# Patient Record
Sex: Female | Born: 1985
Health system: Southern US, Community
[De-identification: ages and names within clinical notes are randomized; demographics above are authoritative.]

## PROBLEM LIST (undated history)

## (undated) DIAGNOSIS — T7840XA Allergy, unspecified, initial encounter: Secondary | ICD-10-CM

## (undated) DIAGNOSIS — F909 Attention-deficit hyperactivity disorder, unspecified type: Secondary | ICD-10-CM

## (undated) DIAGNOSIS — D649 Anemia, unspecified: Secondary | ICD-10-CM

## (undated) DIAGNOSIS — F32A Depression, unspecified: Secondary | ICD-10-CM

## (undated) DIAGNOSIS — G4730 Sleep apnea, unspecified: Secondary | ICD-10-CM

## (undated) DIAGNOSIS — D229 Melanocytic nevi, unspecified: Secondary | ICD-10-CM

## (undated) HISTORY — DX: Anemia, unspecified: D64.9

## (undated) HISTORY — DX: Melanocytic nevi, unspecified: D22.9

## (undated) HISTORY — DX: Depression, unspecified: F32.A

## (undated) HISTORY — DX: Attention-deficit hyperactivity disorder, unspecified type: F90.9

## (undated) HISTORY — DX: Sleep apnea, unspecified: G47.30

## (undated) HISTORY — DX: Allergy, unspecified, initial encounter: T78.40XA

## (undated) HISTORY — PX: WISDOM TOOTH EXTRACTION: SHX21

---

## 2008-04-07 ENCOUNTER — Other Ambulatory Visit: Admission: RE | Admit: 2008-04-07 | Discharge: 2008-04-07 | Payer: Self-pay | Admitting: Family Medicine

## 2008-06-25 ENCOUNTER — Other Ambulatory Visit: Admission: RE | Admit: 2008-06-25 | Discharge: 2008-06-25 | Payer: Self-pay | Admitting: Family Medicine

## 2011-01-25 ENCOUNTER — Other Ambulatory Visit: Payer: Self-pay | Admitting: Family Medicine

## 2011-01-25 ENCOUNTER — Other Ambulatory Visit (HOSPITAL_COMMUNITY)
Admission: RE | Admit: 2011-01-25 | Discharge: 2011-01-25 | Disposition: A | Discharge status: Home / Self Care | Payer: 59 | Source: Ambulatory Visit | Attending: Family Medicine | Admitting: Family Medicine

## 2011-01-25 DIAGNOSIS — R87810 Cervical high risk human papillomavirus (HPV) DNA test positive: Secondary | ICD-10-CM | POA: Insufficient documentation

## 2011-01-25 DIAGNOSIS — Z124 Encounter for screening for malignant neoplasm of cervix: Secondary | ICD-10-CM | POA: Insufficient documentation

## 2011-04-07 ENCOUNTER — Other Ambulatory Visit: Payer: Self-pay | Admitting: Obstetrics and Gynecology

## 2011-10-08 ENCOUNTER — Other Ambulatory Visit (HOSPITAL_COMMUNITY)
Admission: RE | Admit: 2011-10-08 | Discharge: 2011-10-08 | Disposition: A | Discharge status: Home / Self Care | Payer: 59 | Source: Ambulatory Visit | Attending: Obstetrics and Gynecology | Admitting: Obstetrics and Gynecology

## 2011-10-08 ENCOUNTER — Other Ambulatory Visit: Payer: Self-pay | Admitting: Obstetrics and Gynecology

## 2011-10-08 DIAGNOSIS — Z01419 Encounter for gynecological examination (general) (routine) without abnormal findings: Secondary | ICD-10-CM | POA: Insufficient documentation

## 2012-04-26 ENCOUNTER — Other Ambulatory Visit (HOSPITAL_COMMUNITY)
Admission: RE | Admit: 2012-04-26 | Discharge: 2012-04-26 | Disposition: A | Discharge status: Home / Self Care | Payer: Self-pay | Source: Ambulatory Visit | Attending: Obstetrics and Gynecology | Admitting: Obstetrics and Gynecology

## 2012-04-26 ENCOUNTER — Other Ambulatory Visit: Payer: Self-pay | Admitting: Obstetrics and Gynecology

## 2012-04-26 DIAGNOSIS — Z01419 Encounter for gynecological examination (general) (routine) without abnormal findings: Secondary | ICD-10-CM | POA: Insufficient documentation

## 2012-04-26 DIAGNOSIS — Z1151 Encounter for screening for human papillomavirus (HPV): Secondary | ICD-10-CM | POA: Insufficient documentation

## 2017-08-12 DIAGNOSIS — S71131A Puncture wound without foreign body, right thigh, initial encounter: Secondary | ICD-10-CM | POA: Diagnosis not present

## 2017-08-12 DIAGNOSIS — S80851A Superficial foreign body, right lower leg, initial encounter: Secondary | ICD-10-CM | POA: Diagnosis not present

## 2018-05-15 DIAGNOSIS — Z23 Encounter for immunization: Secondary | ICD-10-CM | POA: Diagnosis not present

## 2018-09-21 DIAGNOSIS — Z719 Counseling, unspecified: Secondary | ICD-10-CM | POA: Diagnosis not present

## 2018-09-21 DIAGNOSIS — J309 Allergic rhinitis, unspecified: Secondary | ICD-10-CM | POA: Diagnosis not present

## 2018-10-12 DIAGNOSIS — E663 Overweight: Secondary | ICD-10-CM | POA: Diagnosis not present

## 2018-10-12 DIAGNOSIS — Z719 Counseling, unspecified: Secondary | ICD-10-CM | POA: Diagnosis not present

## 2020-08-14 LAB — HM PAP SMEAR

## 2020-08-14 LAB — RESULTS CONSOLE HPV: CHL HPV: NEGATIVE

## 2021-01-26 LAB — LIPID PANEL
Cholesterol: 204 — AB (ref 0–200)
HDL: 52 (ref 35–70)
LDL Cholesterol: 137
LDl/HDL Ratio: 2.6
Triglycerides: 73 (ref 40–160)

## 2021-01-26 LAB — CBC AND DIFFERENTIAL
HCT: 38 (ref 36–46)
Hemoglobin: 12.8 (ref 12.0–16.0)
Platelets: 223 (ref 150–399)
WBC: 5.4

## 2021-01-26 LAB — BASIC METABOLIC PANEL
BUN: 14 (ref 4–21)
CO2: 22 (ref 13–22)
Chloride: 102 (ref 99–108)
Creatinine: 0.7 (ref 0.5–1.1)
Glucose: 94
Potassium: 4 (ref 3.4–5.3)
Sodium: 136 — AB (ref 137–147)

## 2021-01-26 LAB — HEPATIC FUNCTION PANEL
ALT: 12 (ref 7–35)
AST: 16 (ref 13–35)
Alkaline Phosphatase: 94 (ref 25–125)
Bilirubin, Total: 0.4

## 2021-01-26 LAB — HM HIV SCREENING LAB: HM HIV Screening: NEGATIVE

## 2021-01-26 LAB — COMPREHENSIVE METABOLIC PANEL
Albumin: 4.5 (ref 3.5–5.0)
Calcium: 8.8 (ref 8.7–10.7)
GFR calc non Af Amer: 113

## 2021-01-26 LAB — TSH: TSH: 1.79 (ref 0.41–5.90)

## 2021-01-26 LAB — CBC: RBC: 4.36 (ref 3.87–5.11)

## 2021-01-26 LAB — HIV ANTIBODY (ROUTINE TESTING W REFLEX): HIV 1&2 Ab, 4th Generation: NONREACTIVE

## 2021-04-22 ENCOUNTER — Other Ambulatory Visit: Payer: Self-pay

## 2021-04-22 ENCOUNTER — Ambulatory Visit: Payer: 59 | Admitting: Physician Assistant

## 2021-04-22 ENCOUNTER — Encounter: Payer: Self-pay | Admitting: Physician Assistant

## 2021-04-22 VITALS — BP 110/80 | HR 76 | Temp 98.4°F | Ht 60.0 in

## 2021-04-22 DIAGNOSIS — F909 Attention-deficit hyperactivity disorder, unspecified type: Secondary | ICD-10-CM | POA: Insufficient documentation

## 2021-04-22 DIAGNOSIS — L918 Other hypertrophic disorders of the skin: Secondary | ICD-10-CM

## 2021-04-22 DIAGNOSIS — Q309 Congenital malformation of nose, unspecified: Secondary | ICD-10-CM | POA: Diagnosis not present

## 2021-04-22 NOTE — Patient Instructions (Addendum)
It was great to see you!  When we get your records from Dr. Ernie Hew I will let you know  If your nose area does not improve after cool compresses and reduction of manipulation, let me know and we will refer to ENT.   Take care,  Inda Coke PA-C

## 2021-04-22 NOTE — Progress Notes (Signed)
Adrienne Burch is a 35 y.o. female here to establish care.  History of Present Illness:   Chief Complaint  Patient presents with   Establish Care   ADHD   Nose Problem    Pt c/o pain bridge of nose and feels a slight bump for the past week.    Nose Bump Adrienne Burch began to express concern regarding a bump on the bridge of her nose that has been onset for a week. States she also feels pain upon palpation to the area and believes it could be due to the fact that she has been blowing her nose often. Adrienne Burch has admitted that instead of blowing her nose straight down, she tends to move her nose side to side upon blowing it.  She has taken an at home covid test which resulted as negative.  Denies recent injury, abnormal noises, blood at the site, or accessory changes.   Skin tags Adrienne Burch explains how she has noticed multiple acrochordons on both sides of her neck. They aren't causing any pain or issues but she would like these removed today.   ADHD Currently compliant with taking focalin XR 10 mg daily with no adverse effects. Due to fhx of anxiety and depression she is participating in talk therapy regularly. She is managing well at this time.   Depression screen PHQ 2/9 04/22/2021  Decreased Interest 0  Down, Depressed, Hopeless 0  PHQ - 2 Score 0    No flowsheet data found.   Other providers/specialists: Patient Care Team: Inda Coke, [place] as PCP - General (Physician Assistant)   Past Medical History:  Diagnosis Date   ADHD (attention deficit hyperactivity disorder)    Nevus    2 moles removed     Social History   Tobacco Use   Smoking status: Never   Smokeless tobacco: Never  Vaping Use   Vaping Use: Never used  Substance Use Topics   Alcohol use: Never   Drug use: Never    Past Surgical History:  Procedure Laterality Date   WISDOM TOOTH EXTRACTION     2018, 2019 half done each time    Family History  Problem Relation Age of Onset   Depression Mother     High Cholesterol Mother    Cancer Father    High blood pressure Father    Alcohol abuse Sister    Arthritis Maternal Grandmother    High Cholesterol Maternal Grandmother    Alcohol abuse Maternal Grandfather    Heart disease Paternal Grandmother    High blood pressure Paternal Grandmother    Stroke Paternal Grandmother    Cancer Paternal Grandfather     Allergies  Allergen Reactions   Oxycodone Nausea And Vomiting   Pertussis Vaccines Other (See Comments)    Happened as a child, listless and high fever     Current Medications:   Current Outpatient Medications:    levonorgestrel (MIRENA, 52 MG,) 20 MCG/DAY IUD, 1 each by Intrauterine route once. Inserted by GYN in 08/2019 needs to be removed in 7 yrs 08/2026, Disp: , Rfl:    Multiple Vitamin (MULTIVITAMIN) tablet, Take 1 tablet by mouth daily., Disp: , Rfl:    dexmethylphenidate (FOCALIN XR) 10 MG 24 hr capsule, Take 10 mg by mouth every morning., Disp: , Rfl:    Review of Systems:   ROS Negative unless otherwise specified per HPI.  Vitals:   Vitals:   04/22/21 1502  BP: 110/80  Pulse: 76  Temp: 98.4 F (36.9 C)  TempSrc: Temporal  SpO2: 99%  Height: 5' (1.524 m)      There is no height or weight on file to calculate BMI.  Physical Exam:   Physical Exam Vitals and nursing note reviewed.  Constitutional:      General: She is not in acute distress.    Appearance: She is well-developed. She is not ill-appearing or toxic-appearing.  HENT:     Nose:     Comments: Tenderness to upper cartilage area of nose Cardiovascular:     Rate and Rhythm: Normal rate and regular rhythm.     Pulses: Normal pulses.     Heart sounds: Normal heart sounds, S1 normal and S2 normal.  Pulmonary:     Effort: Pulmonary effort is normal.     Breath sounds: Normal breath sounds.  Skin:    General: Skin is warm and dry.     Comments: Skin tags -- 2 on L side of neck and 2 on R side of neck  Neurological:     Mental Status: She is  alert.     GCS: GCS eye subscore is 4. GCS verbal subscore is 5. GCS motor subscore is 6.  Psychiatric:        Speech: Speech normal.        Behavior: Behavior normal. Behavior is cooperative.   Procedure: Skin tag removal Informed consent:  Discussed risks (permanent scarring, infection, pain, bleeding, bruising, redness, and recurrence of the lesion) and benefits of the procedure, as well as the alternatives.  She is aware that skin tags are benign lesions, and their removal is often not considered medically necessary.  Informed consent was obtained. Anesthesia: Lidocaine with epinephrine  The area was prepared and draped in a standard fashion. Snip removal was performed.   Antibiotic ointment and a sterile dressing were applied.   The patient tolerated procedure well. The patient was instructed on post-op care.   Number of lesions removed:  2 located on right side of neck, 2 on left side of neck.  Assessment and Plan:   Attention Deficit Hyperactivity disorder, unspecified ADHD type Currently well controlled with medication regimen. Will refill Focalin XR 10 mg upon receiving verification of justified use.  Follow-up in office if any concerns. PDMP reviewed - no red flags.  Nasal abnormality No red flags Recommended cool compresses and avoiding touching If worsening or lack of improvement, will refer to ENT  Skin tag removal Tolerated well Denies immediate concerns Follow-up prn  I,Havlyn C Ratchford,acting as a scribe for [employer], PA.,have documented all relevant documentation on the behalf of Inda Coke, PA,as directed by  Inda Coke, PA while in the presence of Inda Coke, [place].  I, Inda Coke, [place], have reviewed all documentation for this visit. The documentation on 04/22/21 for the exam, diagnosis, procedures, and orders are all accurate and complete.   Inda Coke, PA-C

## 2021-06-15 ENCOUNTER — Encounter: Payer: Self-pay | Admitting: Physician Assistant

## 2021-06-23 ENCOUNTER — Encounter: Payer: Self-pay | Admitting: Physician Assistant

## 2021-06-26 ENCOUNTER — Other Ambulatory Visit: Payer: Self-pay

## 2021-06-26 ENCOUNTER — Encounter: Payer: Self-pay | Admitting: Physician Assistant

## 2021-06-26 ENCOUNTER — Ambulatory Visit: Payer: 59 | Admitting: Physician Assistant

## 2021-06-26 VITALS — BP 118/80 | HR 83 | Temp 97.7°F | Ht 60.0 in

## 2021-06-26 DIAGNOSIS — F909 Attention-deficit hyperactivity disorder, unspecified type: Secondary | ICD-10-CM

## 2021-06-26 MED ORDER — DEXMETHYLPHENIDATE HCL ER 10 MG PO CP24: 10.0000 mg | ORAL_CAPSULE | Freq: Every day | ORAL | 0 refills | Status: DC

## 2021-06-26 NOTE — Patient Instructions (Signed)
It was great to see you!  Continue Focalin 10 mg XR  Let's follow-up in 6 months, sooner if you have concerns.  Take care,  Inda Coke PA-C

## 2021-06-26 NOTE — Progress Notes (Signed)
Adrienne Burch is a 36 y.o. female here for a follow up of ADHD.  History of Present Illness:   Chief Complaint  Patient presents with   ADHD    HPI  ADHD Adrienne Burch states that she does take focalin XR 10 mg daily for the most part with no adverse effects. Admits that on days where she knows she is not going to be doing anything, she does not take the medication. Additionally pt did present verification of ADHD dx as request in previous visit on 04/22/21. At this time she is managing well. Denies SI/HI.    Past Medical History:  Diagnosis Date   ADHD (attention deficit hyperactivity disorder)    Nevus    2 moles removed     Social History   Tobacco Use   Smoking status: Never   Smokeless tobacco: Never  Vaping Use   Vaping Use: Never used  Substance Use Topics   Alcohol use: Yes    Comment: once per year   Drug use: Never    Past Surgical History:  Procedure Laterality Date   WISDOM TOOTH EXTRACTION     2018, 2019 half done each time    Family History  Problem Relation Age of Onset   Depression Mother    High Cholesterol Mother    Colon cancer Father 11   High blood pressure Father    Alcohol abuse Sister    Crohn's disease Sister    Arthritis Maternal Grandmother    High Cholesterol Maternal Grandmother    Alcohol abuse Maternal Grandfather    Heart disease Paternal Grandmother    High blood pressure Paternal Grandmother    Stroke Paternal Grandmother    Lung cancer Paternal Grandfather     Allergies  Allergen Reactions   Oxycodone Nausea And Vomiting   Pertussis Vaccines Other (See Comments)    Happened as a child, listless and high fever    Current Medications:   Current Outpatient Medications:    levonorgestrel (MIRENA, 52 MG,) 20 MCG/DAY IUD, 1 each by Intrauterine route once. Inserted by GYN in 08/2019 needs to be removed in 7 yrs 08/2026, Disp: , Rfl:    Multiple Vitamin (MULTIVITAMIN) tablet, Take 1 tablet by mouth daily., Disp: , Rfl:     Review of Systems:   ROS Negative unless otherwise specified per HPI. Vitals:   Vitals:   06/26/21 1134  BP: 118/80  Pulse: 83  Temp: 97.7 F (36.5 C)  TempSrc: Temporal  SpO2: 99%  Height: 5' (1.524 m)     There is no height or weight on file to calculate BMI.  Physical Exam:   Physical Exam Vitals and nursing note reviewed.  Constitutional:      General: She is not in acute distress.    Appearance: She is well-developed. She is not ill-appearing or toxic-appearing.  Cardiovascular:     Rate and Rhythm: Normal rate and regular rhythm.     Pulses: Normal pulses.     Heart sounds: Normal heart sounds, S1 normal and S2 normal.  Pulmonary:     Effort: Pulmonary effort is normal.     Breath sounds: Normal breath sounds.  Skin:    General: Skin is warm and dry.  Neurological:     Mental Status: She is alert.     GCS: GCS eye subscore is 4. GCS verbal subscore is 5. GCS motor subscore is 6.  Psychiatric:        Speech: Speech normal.  Behavior: Behavior normal. Behavior is cooperative.    Assessment and Plan:   Attention deficit hyperactivity disorder (ADHD), unspecified ADHD type Well controlled with medication regimen Continue Focalin XR 10 mg daily PDMP reviewed- no red flags Controlled substance contract signed today Follow up in 6 months, sooner if concerns occur  I,Havlyn C Ratchford,acting as a scribe for [employer], PA.,have documented all relevant documentation on the behalf of Inda Coke, PA,as directed by  Inda Coke, PA while in the presence of Inda Coke, [place].  I, Inda Coke, [place], have reviewed all documentation for this visit. The documentation on 06/26/21 for the exam, diagnosis, procedures, and orders are all accurate and complete.  Inda Coke, PA-C

## 2021-06-30 ENCOUNTER — Ambulatory Visit (INDEPENDENT_AMBULATORY_CARE_PROVIDER_SITE_OTHER): Payer: 59

## 2021-06-30 ENCOUNTER — Ambulatory Visit (HOSPITAL_COMMUNITY)
Admission: EM | Admit: 2021-06-30 | Discharge: 2021-06-30 | Disposition: A | Discharge status: Home / Self Care | Payer: 59 | Attending: Physician Assistant | Admitting: Physician Assistant

## 2021-06-30 ENCOUNTER — Other Ambulatory Visit: Payer: Self-pay

## 2021-06-30 ENCOUNTER — Telehealth: Payer: Self-pay

## 2021-06-30 ENCOUNTER — Ambulatory Visit: Admission: EM | Admit: 2021-06-30 | Payer: 59

## 2021-06-30 ENCOUNTER — Encounter (HOSPITAL_COMMUNITY): Payer: Self-pay

## 2021-06-30 DIAGNOSIS — M79644 Pain in right finger(s): Secondary | ICD-10-CM

## 2021-06-30 DIAGNOSIS — S6991XA Unspecified injury of right wrist, hand and finger(s), initial encounter: Secondary | ICD-10-CM

## 2021-06-30 DIAGNOSIS — M79641 Pain in right hand: Secondary | ICD-10-CM

## 2021-06-30 MED ORDER — NAPROXEN 500 MG PO TABS: 500.0000 mg | ORAL_TABLET | Freq: Two times a day (BID) | ORAL | 0 refills | Status: DC

## 2021-06-30 NOTE — Discharge Instructions (Signed)
Your x-ray was normal with no evidence of fracture which is great news.  Keep your hand elevated and use ice to manage swelling.  Take Naprosyn for pain relief.  You should not take additional NSAIDs including aspirin, ibuprofen/Advil, naproxen/Aleve with this medication as it can cause stomach bleeding.  You can use Tylenol as needed for breakthrough pain.  If your symptoms or not improving within a few days please follow-up with specialist; call to schedule an appointment.  If you develop any weakness, numbness, tingling sensation, inability to move your hand you should be seen immediately.

## 2021-06-30 NOTE — ED Triage Notes (Signed)
Pt reports jamming her R hand into a door today.   States she feels some pain and presents with minimum ROM.

## 2021-06-30 NOTE — Telephone Encounter (Signed)
Patient called asking what she should do. She slammed her hand in the door. Middle finger swollen and purple. Cannot bend well. Urgent Care advised.

## 2021-06-30 NOTE — ED Provider Notes (Signed)
Day    CSN: [id number] Arrival date & time: 06/30/21  1257      History   Chief Complaint Chief Complaint  Patient presents with   Hand Injury    HPI Adrienne Burch is a 36 y.o. female.   Patient presents today with a several hour history of right hand pain.  Reports earlier she was holding onto the door when she accidentally caught her index, middle, ring finger of her right hand in the door jam.  She reports pain is currently rated 5 on a 0-10 pain scale, localized to fingers, described as aching, worse with palpation or movement, no alleviating factors identified.  She has not tried any over-the-counter medication for symptom management.  She is right-handed and knits, crochets, plays instrument and so is very concerned about potential injury.  She is confident that she is not pregnant.  She denies any specific numbness or paresthesias but does report an overall heavy sensation and dull pain involving these fingers.   Past Medical History:  Diagnosis Date   ADHD (attention deficit hyperactivity disorder)    Nevus    2 moles removed    Patient Active Problem List   Diagnosis Date Noted   ADHD 04/22/2021    Past Surgical History:  Procedure Laterality Date   WISDOM TOOTH EXTRACTION     2018, 2019 half done each time    OB History   No obstetric history on file.      Home Medications    Prior to Admission medications   Medication Sig Start Date End Date Taking? Authorizing Provider  naproxen (NAPROSYN) 500 MG tablet Take 1 tablet (500 mg total) by mouth 2 (two) times daily. 06/30/21  Yes An Lannan, Derry Skill, PA-C  dexmethylphenidate (FOCALIN XR) 10 MG 24 hr capsule Take 1 capsule (10 mg total) by mouth daily. 06/26/21   Inda Coke, PA  levonorgestrel (MIRENA, 52 MG,) 20 MCG/DAY IUD 1 each by Intrauterine route once. Inserted by GYN in 08/2019 needs to be removed in 7 yrs 08/2026    [provider]  Multiple Vitamin (MULTIVITAMIN)  tablet Take 1 tablet by mouth daily.    [provider]    Family History Family History  Problem Relation Age of Onset   Depression Mother    High Cholesterol Mother    Colon cancer Father 57   High blood pressure Father    Alcohol abuse Sister    Crohn's disease Sister    Arthritis Maternal Grandmother    High Cholesterol Maternal Grandmother    Alcohol abuse Maternal Grandfather    Heart disease Paternal Grandmother    High blood pressure Paternal Grandmother    Stroke Paternal Grandmother    Lung cancer Paternal Grandfather     Social History Social History   Tobacco Use   Smoking status: Never   Smokeless tobacco: Never  Vaping Use   Vaping Use: Never used  Substance Use Topics   Alcohol use: Yes    Comment: once per year   Drug use: Never     Allergies   Oxycodone and Pertussis vaccines   Review of Systems Review of Systems  Constitutional:  Positive for activity change. Negative for appetite change, fatigue and fever.  Respiratory:  Negative for cough and shortness of breath.   Cardiovascular:  Negative for chest pain.  Gastrointestinal:  Negative for abdominal pain, diarrhea, nausea and vomiting.  Musculoskeletal:  Positive for arthralgias and myalgias. Negative for joint swelling.  Skin:  Negative for color change and wound.  Neurological:  Negative for dizziness, weakness, light-headedness and headaches.    Physical Exam Triage Vital Signs ED Triage Vitals  Enc Vitals Group     BP 06/30/21 1349 128/80     Pulse Rate 06/30/21 1349 71     Resp 06/30/21 1349 17     Temp 06/30/21 1349 98.1 F (36.7 C)     Temp Source 06/30/21 1349 Oral     SpO2 06/30/21 1349 100 %     Weight --      Height --      Head Circumference --      Peak Flow --      Pain Score 06/30/21 1347 4     Pain Loc --      Pain Edu? --      Excl. in [place]? --    No data found.  Updated Vital Signs BP 128/80 (BP Location: Right Arm)    Pulse 71    Temp 98.1 F (36.7  C) (Oral)    Resp 17    SpO2 100%   Visual Acuity Right Eye Distance:   Left Eye Distance:   Bilateral Distance:    Right Eye Near:   Left Eye Near:    Bilateral Near:     Physical Exam Vitals reviewed.  Constitutional:      General: She is awake. She is not in acute distress.    Appearance: Normal appearance. She is well-developed. She is not ill-appearing.     Comments: Very pleasant female appears stated age in no acute distress sitting comfortably in exam room  HENT:     Head: Normocephalic and atraumatic.  Cardiovascular:     Rate and Rhythm: Normal rate and regular rhythm.     Pulses:          Radial pulses are 2+ on the right side and 2+ on the left side.     Heart sounds: Normal heart sounds, S1 normal and S2 normal. No murmur heard.    Comments: Capillary fill within 2 seconds right fingers; only able to visualize capillary refill at nailbed due to nail polish. Pulmonary:     Effort: Pulmonary effort is normal.     Breath sounds: Normal breath sounds. No wheezing, rhonchi or rales.     Comments: Clear to auscultation bilaterally Musculoskeletal:     Right hand: Tenderness present. No swelling or bony tenderness. Decreased range of motion. Normal strength. Normal sensation. There is no disruption of two-point discrimination.     Right lower leg: No edema.     Left lower leg: No edema.     Comments: Right hand: Decreased range of motion with flexion of index/middle/ring finger.  Swelling noted with mild bruising.  Normal pincer grip strength.  Hand neurovascularly intact.  Psychiatric:        Behavior: Behavior is cooperative.     UC Treatments / Results  Labs (all labs ordered are listed, but only abnormal results are displayed) Labs Reviewed - No data to display  EKG   Radiology DG Hand Complete Right  Result Date: 06/30/2021 CLINICAL DATA:  Pt got her 2nd-4th finger stuck in a door today. EXAM: RIGHT HAND - COMPLETE 3+ VIEW COMPARISON:  None. FINDINGS:  There is no evidence of fracture or dislocation. There is no evidence of arthropathy or other focal bone abnormality. Soft tissues are unremarkable. IMPRESSION: Negative. Electronically Signed   By: Kathreen Devoid M.D.   On: 06/30/2021 14:07  Procedures Procedures (including critical care time)  Medications Ordered in UC Medications - No data to display  Initial Impression / Assessment and Plan / UC Course  I have reviewed the triage vital signs and the nursing notes.  Pertinent labs & imaging results that were available during my care of the patient were reviewed by me and considered in my medical decision making (see chart for details).     X-ray obtained given mechanism of injury showed no osseous abnormalities.  Discussed likely contusion as etiology of symptoms.  Encourage patient to use RICE protocol particularly elevation and ice to help with symptoms.  She was prescribed Naprosyn for symptom relief with instruction not to take additional NSAIDs with this medication due to risk of GI bleeding.  Encouraged her to avoid strenuous activity including regular use of dominant hand and was provided work excuse note for several days.  Discussed that if she has persistent symptoms or symptoms or not improving within a few days of should contact an specialist and was given contact information for local provider.  Discussed alarm symptoms that warrant emergent evaluation.  Strict return precautions given to which she expressed understanding.  Final Clinical Impressions(s) / UC Diagnoses   Final diagnoses:  Right hand pain  Hand injury, right, initial encounter     Discharge Instructions      Your x-ray was normal with no evidence of fracture which is great news.  Keep your hand elevated and use ice to manage swelling.  Take Naprosyn for pain relief.  You should not take additional NSAIDs including aspirin, ibuprofen/Advil, naproxen/Aleve with this medication as it can cause stomach bleeding.   You can use Tylenol as needed for breakthrough pain.  If your symptoms or not improving within a few days please follow-up with specialist; call to schedule an appointment.  If you develop any weakness, numbness, tingling sensation, inability to move your hand you should be seen immediately.     ED Prescriptions     Medication Sig Dispense Auth. Provider   naproxen (NAPROSYN) 500 MG tablet Take 1 tablet (500 mg total) by mouth 2 (two) times daily. 30 tablet Laurella Tull, Derry Skill, PA-C      PDMP not reviewed this encounter.   Terrilee Croak, PA-C 06/30/21 1450

## 2021-10-21 ENCOUNTER — Telehealth: Payer: Self-pay | Admitting: Physician Assistant

## 2021-10-21 NOTE — Telephone Encounter (Signed)
Pt requesting 90 day supply ?Pt requesting generic. ? ?. ?Encourage patient to contact the pharmacy for refills or they can request refills through [facility] ? ?LAST APPOINTMENT DATE:   ?06/26/21 ? ?NEXT APPOINTMENT DATE: ?01/01/22 ? ? ?MEDICATION: ?dexmethylphenidate (FOCALIN XR) 10 MG 24 hr capsule [[id number]]  ? ?Is the patient out of medication?  ?3 weeks left ? ?PHARMACY: ?[facility] - [place], [place]  ?[place], [place] [address]  ?Phone:  [phone]  Fax:  [phone] ? ?Let patient know to contact pharmacy at the end of the day to make sure medication is ready. ? ?Please notify patient to allow 48-72 hours to process  ?

## 2021-10-22 ENCOUNTER — Other Ambulatory Visit: Payer: Self-pay | Admitting: Physician Assistant

## 2021-10-22 MED ORDER — DEXMETHYLPHENIDATE HCL ER 10 MG PO CP24: 10.0000 mg | ORAL_CAPSULE | Freq: Every day | ORAL | 0 refills | Status: DC

## 2021-10-22 NOTE — Telephone Encounter (Signed)
Please see note for correct pharmacy per patient

## 2021-10-22 NOTE — Telephone Encounter (Signed)
Pt states rx was sent to wrong pharmacy. Please update and send to:   [facility] ([facility] ) - [place], [place]  [address] [place] [address]  Phone:  [phone]  Fax:  [phone]

## 2021-10-22 NOTE — Telephone Encounter (Signed)
Ok to refill 

## 2021-10-23 ENCOUNTER — Other Ambulatory Visit: Payer: Self-pay | Admitting: Physician Assistant

## 2021-10-23 MED ORDER — DEXMETHYLPHENIDATE HCL ER 10 MG PO CP24: 10.0000 mg | ORAL_CAPSULE | Freq: Every day | ORAL | 0 refills | Status: DC

## 2021-11-11 ENCOUNTER — Other Ambulatory Visit: Payer: Self-pay | Admitting: *Deleted

## 2021-11-11 NOTE — Telephone Encounter (Signed)
Pt called back reporting that [facility] was out of stock of dexmethylphenidate (FOCALIN XR) 10 MG 24 hr capsule [[id number]] . After calling around to multiple pharmacies she was able to find one that had her medication in stock. [facility] [address], [place], [place] [address]. Pt has about 4-5 days supply of medication left.

## 2021-11-11 NOTE — Telephone Encounter (Signed)
Adrienne Burch, pt needs refill on Focalin XR 10 mg sent to different pharmacy due to out of stock original pharmacy. I have updated pharmacy.

## 2021-11-11 NOTE — Telephone Encounter (Signed)
See other message sent to provider.

## 2021-11-12 MED ORDER — DEXMETHYLPHENIDATE HCL ER 10 MG PO CP24: 10.0000 mg | ORAL_CAPSULE | Freq: Every day | ORAL | 0 refills | Status: DC

## 2021-12-21 ENCOUNTER — Telehealth: Payer: Self-pay | Admitting: Physician Assistant

## 2021-12-21 MED ORDER — DEXMETHYLPHENIDATE HCL ER 10 MG PO CP24: 10.0000 mg | ORAL_CAPSULE | Freq: Every day | ORAL | 0 refills | Status: DC

## 2021-12-21 NOTE — Telephone Encounter (Signed)
Pt requesting refill for Focalin XR 10 mg. Pt is scheduled to see you on 7/28.

## 2021-12-21 NOTE — Telephone Encounter (Signed)
..   Encourage patient to contact the pharmacy for refills or they can request refills through [facility]:  Please schedule appointment if longer than 1 year  06/26/21  NEXT APPOINTMENT DATE: 01/01/22  MEDICATION:  30 Day RX dexmethylphenidate (FOCALIN XR) 10 MG 24 hr capsule  Is the patient out of medication? 1 week left  [facility] [address] [facility] [place] [facility]  Let patient know to contact pharmacy at the end of the day to make sure medication is ready.  Please notify patient to allow 48-72 hours to process

## 2022-01-01 ENCOUNTER — Ambulatory Visit: Payer: 59 | Admitting: Physician Assistant

## 2022-01-01 ENCOUNTER — Encounter: Payer: Self-pay | Admitting: Physician Assistant

## 2022-01-01 VITALS — BP 131/82 | HR 88 | Temp 98.3°F | Ht 60.0 in

## 2022-01-01 DIAGNOSIS — K929 Disease of digestive system, unspecified: Secondary | ICD-10-CM

## 2022-01-01 DIAGNOSIS — D229 Melanocytic nevi, unspecified: Secondary | ICD-10-CM | POA: Diagnosis not present

## 2022-01-01 DIAGNOSIS — L918 Other hypertrophic disorders of the skin: Secondary | ICD-10-CM

## 2022-01-01 NOTE — Patient Instructions (Signed)
It was great to see you!  I will be in touch with skin biopsy results  Referral to GI placed today -- they will call you.  Take care,  Inda Coke PA-C

## 2022-01-01 NOTE — Progress Notes (Signed)
Adrienne Burch is a 36 y.o. female here for a follow up of a pre-existing problem.  History of Present Illness:   Chief Complaint  Patient presents with   Follow-up    Having digestive issues,and 3 moles pt wants looked at.    HPI  Digestive problems Has always had some intestinal issues. Sister with Crohns, dad with intestinal cancer, aunt with colon cancer. She has increased gas, stool frequency. Had some suspected dairy intolerance. Occasional diarrhea. Breakfast seems to always "go through" her. This has been increased over the past 2-3 months. She does not feel like its related to focalin. Does not see blood in stool. Does not have abdominal pain.   Will occasional take lactaid and this does help. Would like to see GI.  Skin tags Has three skin tags that she would like removed. They are in bothersome locations.  Abnormal mole She has a mole on the left side of her abdomen and feels like its becoming more irregular. She is worried about this and would like it evaluated.    Past Medical History:  Diagnosis Date   ADHD (attention deficit hyperactivity disorder)    Nevus    2 moles removed     Social History   Tobacco Use   Smoking status: Never   Smokeless tobacco: Never  Vaping Use   Vaping Use: Never used  Substance Use Topics   Alcohol use: Yes    Comment: once per year   Drug use: Never    Past Surgical History:  Procedure Laterality Date   WISDOM TOOTH EXTRACTION     2018, 2019 half done each time    Family History  Problem Relation Age of Onset   Depression Mother    High Cholesterol Mother    Colon cancer Father 24   High blood pressure Father    Alcohol abuse Sister    Crohn's disease Sister    Arthritis Maternal Grandmother    High Cholesterol Maternal Grandmother    Alcohol abuse Maternal Grandfather    Heart disease Paternal Grandmother    High blood pressure Paternal Grandmother    Stroke Paternal Grandmother    Lung cancer  Paternal Grandfather     Allergies  Allergen Reactions   Oxycodone Nausea And Vomiting   Pertussis Vaccines Other (See Comments)    Happened as a child, listless and high fever    Current Medications:   Current Outpatient Medications:    dexmethylphenidate (FOCALIN XR) 10 MG 24 hr capsule, Take 1 capsule (10 mg total) by mouth daily., Disp: 90 capsule, Rfl: 0   levonorgestrel (MIRENA, 52 MG,) 20 MCG/DAY IUD, 1 each by Intrauterine route once. Inserted by GYN in 08/2019 needs to be removed in 7 yrs 08/2026, Disp: , Rfl:    Multiple Vitamin (MULTIVITAMIN) tablet, Take 1 tablet by mouth daily., Disp: , Rfl:    naproxen (NAPROSYN) 500 MG tablet, Take 1 tablet (500 mg total) by mouth 2 (two) times daily., Disp: 30 tablet, Rfl: 0   Review of Systems:   ROS Negative unless otherwise specified per HPI.  Vitals:   Vitals:   01/01/22 1008  BP: 131/82  Pulse: 88  Temp: 98.3 F (36.8 C)  SpO2: 99%  Height: 5' (1.524 m)     There is no height or weight on file to calculate BMI.  Physical Exam:   Physical Exam Vitals and nursing note reviewed.  Constitutional:      General: She is not in acute distress.  Appearance: She is well-developed. She is not ill-appearing or toxic-appearing.  Cardiovascular:     Rate and Rhythm: Normal rate and regular rhythm.     Pulses: Normal pulses.     Heart sounds: Normal heart sounds, S1 normal and S2 normal.  Pulmonary:     Effort: Pulmonary effort is normal.     Breath sounds: Normal breath sounds.  Skin:    General: Skin is warm and dry.     Comments: Multiple skin tags scattered on body Hyperpigmented irregular nevus to L abdominal wall  Neurological:     Mental Status: She is alert.     GCS: GCS eye subscore is 4. GCS verbal subscore is 5. GCS motor subscore is 6.  Psychiatric:        Speech: Speech normal.        Behavior: Behavior normal. Behavior is cooperative.    Procedure: Skin Tag Removal Consent:  Risks and benefits of  therapy discussed with patient who voices understanding and agrees with planned care. No barriers to communication or understanding identified.  After obtaining informed consent, the patient's identity, procedure, and site were verified during a pause prior to proceeding with the minor surgical procedure as per universal protocol recommendations.    Meds, vitals, and allergies reviewed.   Area was cleaned with betadine and alcohol swab. Approximately 1/2 cc of 2% lidocaine with epi injected into base of skin tag. Lesion was then shaved and removed with forceps.  Number of skin tags removed and location: 3 - R labia, R groin, back of neck  Approximate loss of bleeding is less than 1cc.  Silver nitrate applied to area. Patient tolerated procedure well without any apparent complications. Sterile bandage placed.  Procedure: Shave biopsy Consent:  Risks and benefits of therapy discussed with patient who voices understanding and agrees with planned care. No barriers to communication or understanding identified.  After obtaining informed consent, the patient's identity, procedure, and site were verified during a pause prior to proceeding with the minor surgical procedure as per universal protocol recommendations.    Meds, vitals, and allergies reviewed.   Area was cleaned with betadine and alcohol swab. Approximately 1/2 cc of 2% lidocaine with epi injected into base of skin lesion approximately 64m on L abdominal wall. Lesion was then shaved and removed with forceps.   Approximate loss of bleeding is less than 1cc.  Silver nitrate applied to area. Patient tolerated procedure well without any apparent complications. Sterile bandage placed.   Assessment and Plan:   Digestive problems No red flags Referral to GI Follow-up with [facility] needed  Atypical mole Lesion removed and sent for pathology  Skin tags, multiple acquired Aftercare, including wound care, risks of bleeding, and risks of  recurrence were discussed. All questions answered.  Return for retreatment as needed for further evaluation and management.    SInda Coke PA-C

## 2022-03-01 ENCOUNTER — Encounter: Payer: Self-pay | Admitting: *Deleted

## 2022-03-01 ENCOUNTER — Encounter: Payer: Self-pay | Admitting: Physician Assistant

## 2022-03-03 IMAGING — DX DG HAND COMPLETE 3+V*R*
3 series · 3 of 3 positions shown · non-contrast
Comparison: None.

CLINICAL DATA: Pt got her 5nd-0th finger stuck in a door today.

EXAM:
RIGHT HAND - COMPLETE 3+ VIEW

[hand pa]
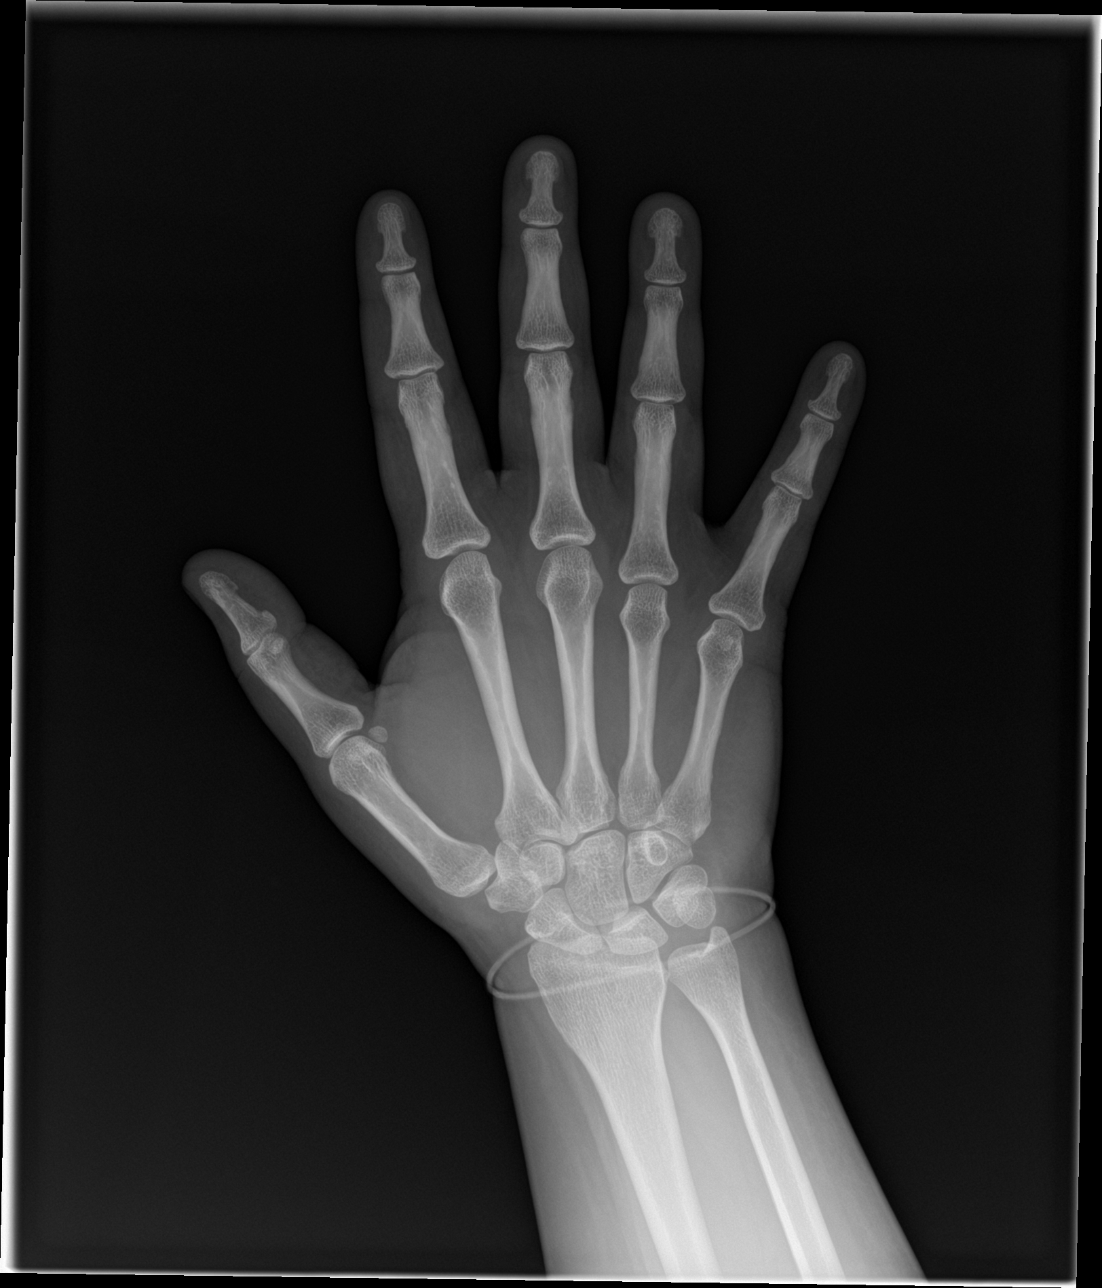

[hand obl]
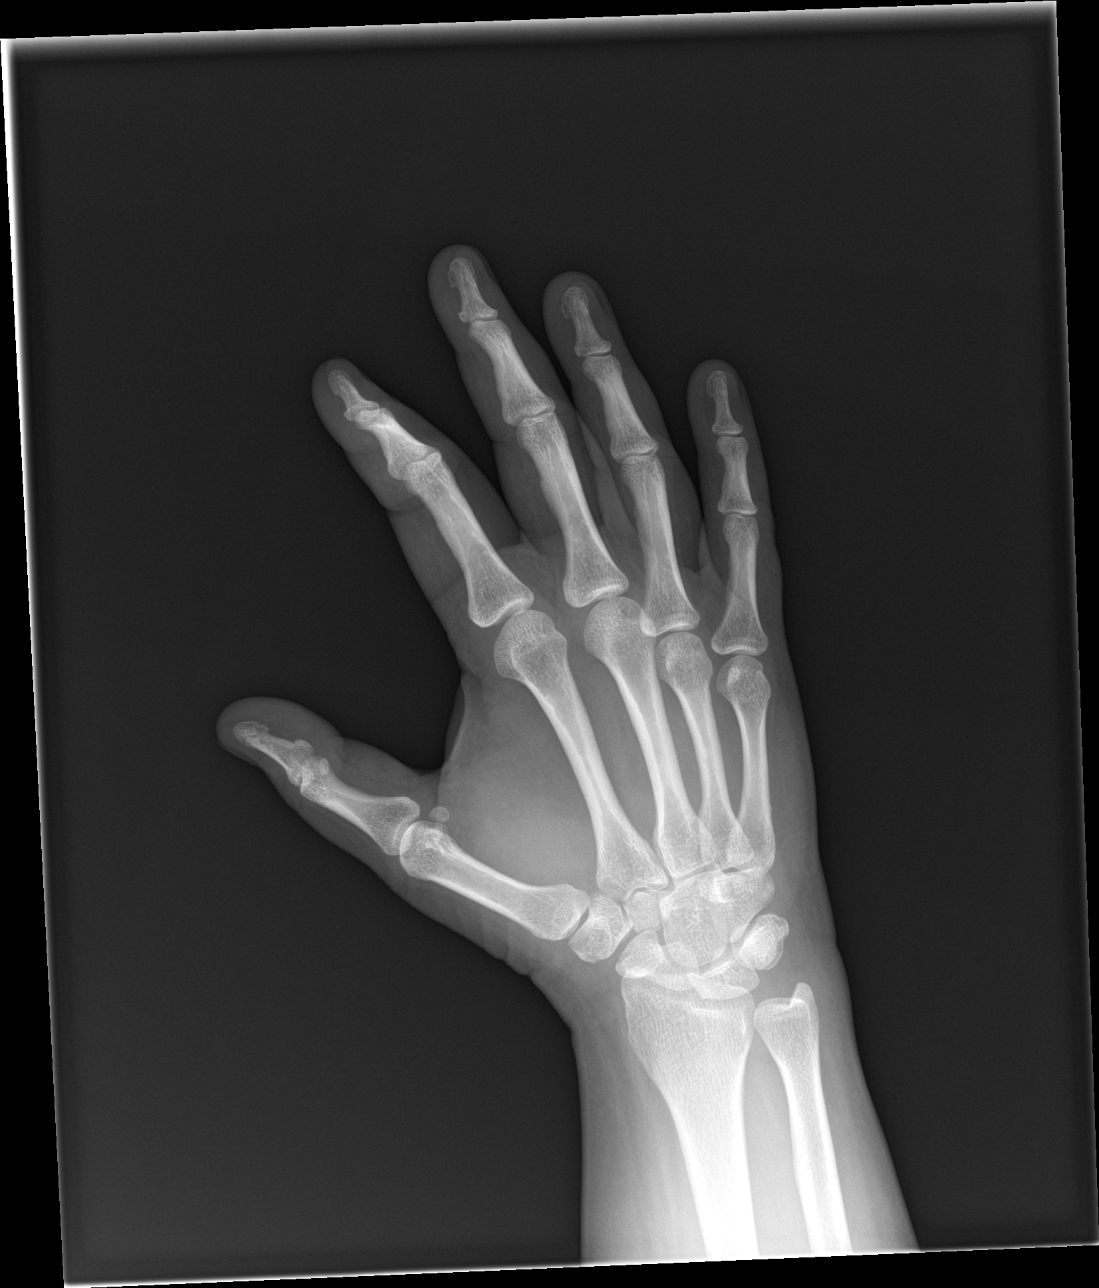

[hand lat]
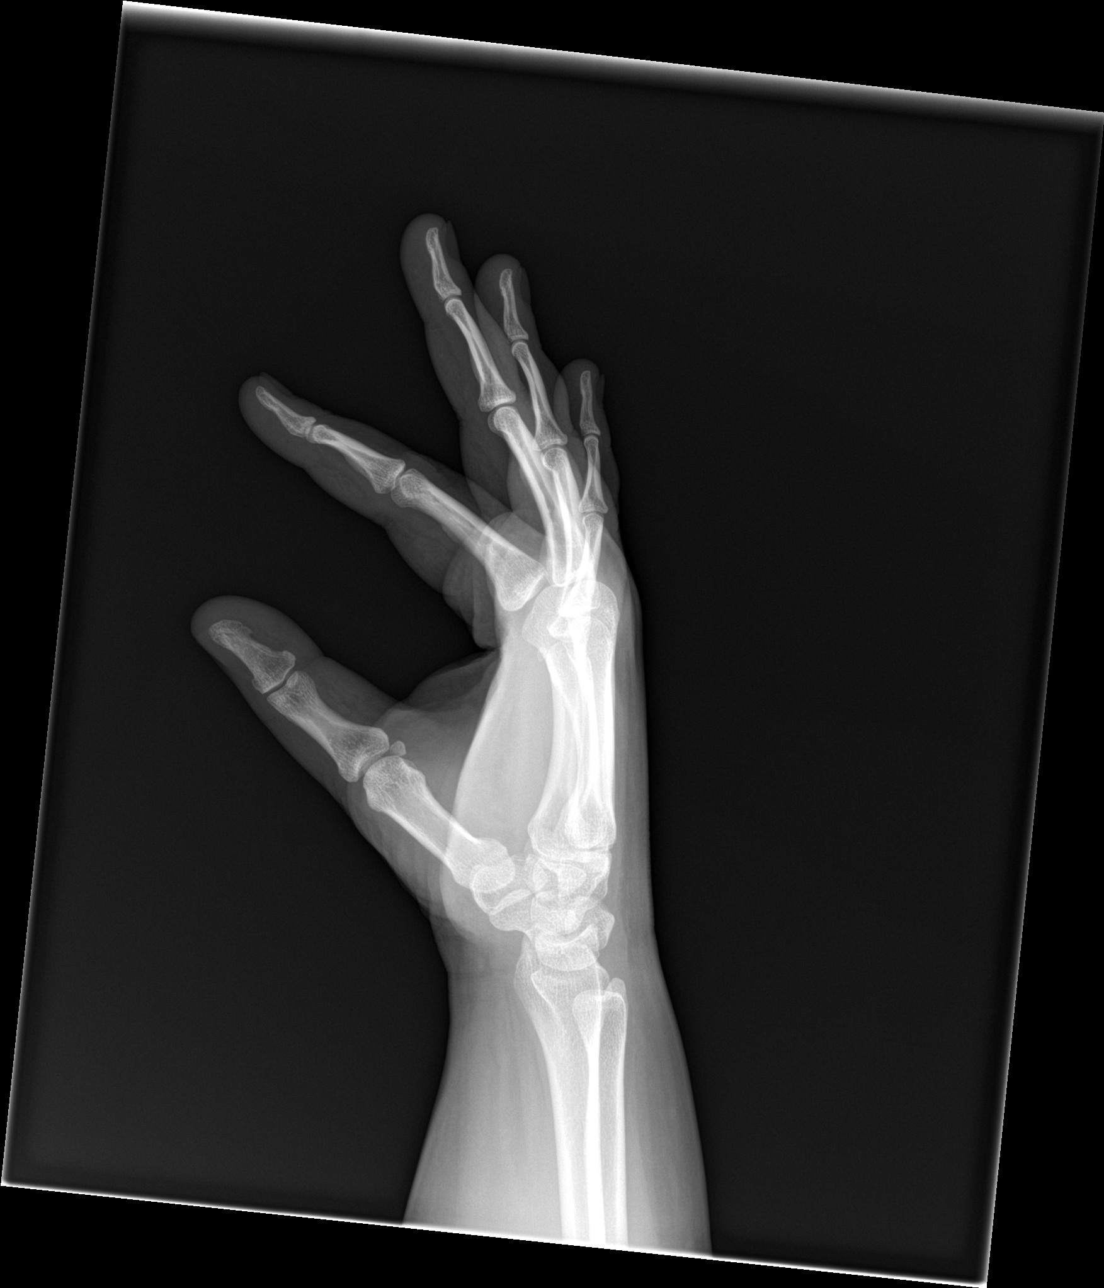

[3 of 3 positions shown; findings below may reference images not displayed]

FINDINGS: There is no evidence of fracture or dislocation. There is no
evidence of arthropathy or other focal bone abnormality. Soft
tissues are unremarkable.
IMPRESSION: Negative.

## 2022-04-16 ENCOUNTER — Telehealth: Payer: Self-pay | Admitting: Physician Assistant

## 2022-04-16 NOTE — Telephone Encounter (Signed)
Pt states: -would like a 90 days supply for mail order.   LAST APPOINTMENT DATE:   01/01/22 OV with PCP   NEXT APPOINTMENT DATE: N/A   MEDICATION: dexmethylphenidate (FOCALIN XR) 10 MG 24 hr capsule [[id number]]   Is the patient out of medication?  No. Starting process so mail-order pharmacy has time to fill.  PHARMACY: [facility]

## 2022-04-19 NOTE — Telephone Encounter (Signed)
Please call pt and schedule an appt with Adrienne Burch for ADHD follow up. She is over due.

## 2022-04-22 NOTE — Progress Notes (Signed)
Adrienne Burch is a 36 y.o. female here for a follow up of a pre-existing problem.  History of Present Illness:   Chief Complaint  Patient presents with   ADHD    HPI  ADHD  Currently compliant with taking focalin XR 10 mg daily with no adverse effects. She is managing well at this time. Denies issues with insomnia, worsening anxiety.   Past Medical History:  Diagnosis Date   ADHD (attention deficit hyperactivity disorder)    Nevus    2 moles removed     Social History   Tobacco Use   Smoking status: Never   Smokeless tobacco: Never  Vaping Use   Vaping Use: Never used  Substance Use Topics   Alcohol use: Yes    Comment: once per year   Drug use: Never    Past Surgical History:  Procedure Laterality Date   WISDOM TOOTH EXTRACTION     2018, 2019 half done each time    Family History  Problem Relation Age of Onset   Depression Mother    High Cholesterol Mother    Colon cancer Father 14   High blood pressure Father    Alcohol abuse Sister    Crohn's disease Sister    Arthritis Maternal Grandmother    High Cholesterol Maternal Grandmother    Alcohol abuse Maternal Grandfather    Heart disease Paternal Grandmother    High blood pressure Paternal Grandmother    Stroke Paternal Grandmother    Lung cancer Paternal Grandfather     Allergies  Allergen Reactions   Oxycodone Nausea And Vomiting   Pertussis Vaccines Other (See Comments)    Happened as a child, listless and high fever    Current Medications:   Current Outpatient Medications:    dexmethylphenidate (FOCALIN XR) 10 MG 24 hr capsule, Take 1 capsule (10 mg total) by mouth daily., Disp: 90 capsule, Rfl: 0   levonorgestrel (MIRENA, 52 MG,) 20 MCG/DAY IUD, 1 each by Intrauterine route once. Inserted by GYN in 08/2019 needs to be removed in 7 yrs 08/2026, Disp: , Rfl:    Multiple Vitamin (MULTIVITAMIN) tablet, Take 1 tablet by mouth daily., Disp: , Rfl:    Review of Systems:   Review of Systems   Constitutional:  Negative for chills, fever, malaise/fatigue and weight loss.  HENT:  Negative for hearing loss, sinus pain and sore throat.   Respiratory:  Negative for cough and hemoptysis.   Cardiovascular:  Negative for chest pain, palpitations, leg swelling and PND.  Gastrointestinal:  Negative for abdominal pain, constipation, diarrhea, heartburn, nausea and vomiting.  Genitourinary:  Negative for dysuria, frequency and urgency.  Musculoskeletal:  Negative for back pain, myalgias and neck pain.  Skin:  Negative for itching and rash.  Neurological:  Negative for dizziness, tingling, seizures and headaches.  Endo/Heme/Allergies:  Negative for polydipsia.  Psychiatric/Behavioral:  Negative for depression. The patient is not nervous/anxious.     Vitals:   Vitals:   04/26/22 1356  BP: 122/84  Pulse: 77  Temp: 97.7 F (36.5 C)  TempSrc: Temporal  SpO2: 98%  Height: 5' (1.524 m)     There is no height or weight on file to calculate BMI.  Physical Exam:   Physical Exam Constitutional:      Appearance: Normal appearance. She is well-developed.  HENT:     Head: Normocephalic and atraumatic.  Eyes:     General: Lids are normal.     Extraocular Movements: Extraocular movements intact.  Conjunctiva/sclera: Conjunctivae normal.  Pulmonary:     Effort: Pulmonary effort is normal.  Musculoskeletal:        General: Normal range of motion.     Cervical back: Normal range of motion and neck supple.  Skin:    General: Skin is warm and dry.  Neurological:     Mental Status: She is alert and oriented to person, place, and time.  Psychiatric:        Attention and Perception: Attention and perception normal.        Mood and Affect: Mood normal.        Behavior: Behavior normal.        Thought Content: Thought content normal.        Judgment: Judgment normal.     Assessment and Plan:   Attention deficit hyperactivity disorder (ADHD), unspecified ADHD type Currently well  controlled Continue Focalin XR 10 mg daily PDMP reviewed - no red flags Follow-up in 6 months, sooner if concerns  I,Moesha Myer,acting as a scribe for [employer], PA.,have documented all relevant documentation on the behalf of Inda Coke, PA,as directed by  Inda Coke, PA while in the presence of Inda Coke, [place].  I, Inda Coke, [place], have reviewed all documentation for this visit. The documentation on 04/26/22 for the exam, diagnosis, procedures, and orders are all accurate and complete.  Inda Coke PA-C

## 2022-04-26 ENCOUNTER — Encounter: Payer: Self-pay | Admitting: Physician Assistant

## 2022-04-26 ENCOUNTER — Ambulatory Visit: Payer: 59 | Admitting: Physician Assistant

## 2022-04-26 VITALS — BP 122/84 | HR 77 | Temp 97.7°F | Ht 60.0 in

## 2022-04-26 DIAGNOSIS — F909 Attention-deficit hyperactivity disorder, unspecified type: Secondary | ICD-10-CM | POA: Diagnosis not present

## 2022-04-26 MED ORDER — DEXMETHYLPHENIDATE HCL ER 10 MG PO CP24: 10.0000 mg | ORAL_CAPSULE | Freq: Every day | ORAL | 0 refills | Status: DC

## 2022-05-03 ENCOUNTER — Telehealth: Payer: Self-pay | Admitting: Physician Assistant

## 2022-05-03 NOTE — Telephone Encounter (Signed)
Left detailed message on personal voicemail, please call around to pharmacy's and find out which pharmacy has your medication and we will send it in for you. Please let us know.

## 2022-05-03 NOTE — Telephone Encounter (Signed)
Patient states [facility] is out ofn stock for dexmethylphenidate (FOCALIN XR) 10 MG 24 hr capsule   Patient requests new Rx for dexmethylphenidate (FOCALIN XR) 10 MG 24 hr capsule be sent to [facility] on [address], if they are out of stock, Patient requests Rx be sent to either [facility] in [place] or [place]  Patient states she will be out of the above medication by end this week

## 2022-05-04 ENCOUNTER — Other Ambulatory Visit: Payer: Self-pay | Admitting: Family Medicine

## 2022-05-04 MED ORDER — DEXMETHYLPHENIDATE HCL ER 10 MG PO CP24: 10.0000 mg | ORAL_CAPSULE | Freq: Every day | ORAL | 0 refills | Status: DC

## 2022-05-04 NOTE — Telephone Encounter (Signed)
Spoke to pt told her Rx was sent to pharmacy as requested. Pt verbalized understanding.

## 2022-05-04 NOTE — Telephone Encounter (Signed)
Pt requesting refill for Focalin XR 10 mg. Pharmacy updated for Rx. Last OV 04/26/2022.

## 2022-05-04 NOTE — Telephone Encounter (Signed)
Pt has found a pharmacy that has rx in stock.  [facility] [place], [place] [address] [phone]

## 2022-05-06 NOTE — Telephone Encounter (Signed)
Pt States: -Pharmacy was able to fill 20 pills -The rest of the Rx is now voided. -A new prescription will be needed around 12/19 -Pharmacy put in a note that she only received 20 pills today 05/06/22 -She will call back when she finds a Pharmacy with supply and needs the new Rx sent.

## 2022-05-07 NOTE — Telephone Encounter (Signed)
Noted  

## 2022-05-24 ENCOUNTER — Other Ambulatory Visit: Payer: Self-pay | Admitting: Physician Assistant

## 2022-05-24 MED ORDER — DEXMETHYLPHENIDATE HCL ER 10 MG PO CP24: 10.0000 mg | ORAL_CAPSULE | Freq: Every day | ORAL | 0 refills | Status: DC

## 2022-05-24 NOTE — Telephone Encounter (Signed)
Left detailed message on personal voicemail Rx for Focalin XR 10 mg [facility] [address] as requested. Any questions please contact the office.

## 2022-05-24 NOTE — Telephone Encounter (Addendum)
Pt requesting refill for Focalin XR 10 mg. Pharmacy updated.

## 2022-06-25 ENCOUNTER — Other Ambulatory Visit: Payer: Self-pay | Admitting: Physician Assistant

## 2022-06-25 MED ORDER — DEXMETHYLPHENIDATE HCL ER 10 MG PO CP24: 10.0000 mg | ORAL_CAPSULE | Freq: Every day | ORAL | 0 refills | Status: DC

## 2022-06-25 NOTE — Telephone Encounter (Signed)
  LAST APPOINTMENT DATE:  04/26/22  NEXT APPOINTMENT DATE:  MEDICATION:  dexmethylphenidate (FOCALIN XR) 10 MG 24 hr capsule   Is the patient out of medication?   [facility]AC[facility] [place], [place]  Let patient know to contact pharmacy at the end of the day to make sure medication is ready.  Please notify patient to allow 48-72 hours to process

## 2022-06-25 NOTE — Telephone Encounter (Signed)
Pt requesting refill for Focalin XR 10 mg. Last OV 04/26/2022.

## 2022-07-19 ENCOUNTER — Encounter: Payer: Self-pay | Admitting: Physician Assistant

## 2022-07-19 ENCOUNTER — Ambulatory Visit: Payer: 59 | Admitting: Physician Assistant

## 2022-07-19 VITALS — BP 126/80 | HR 68 | Temp 97.8°F | Ht 60.0 in

## 2022-07-19 DIAGNOSIS — J029 Acute pharyngitis, unspecified: Secondary | ICD-10-CM | POA: Diagnosis not present

## 2022-07-19 LAB — POC INFLUENZA A&B (BINAX/QUICKVUE)
Influenza A, POC: NEGATIVE
Influenza B, POC: NEGATIVE

## 2022-07-19 LAB — POC COVID19 BINAXNOW: SARS Coronavirus 2 Ag: NEGATIVE

## 2022-07-19 LAB — POCT RAPID STREP A (OFFICE): Rapid Strep A Screen: NEGATIVE

## 2022-07-19 NOTE — Patient Instructions (Signed)
It was great to see you!  All testing has come back negative  Push fluids and rest  Try to do another COVID test tomorrow  Potentially return to work Wednesday, message me if note extension needed  Take care,  Inda Coke PA-C

## 2022-07-19 NOTE — Progress Notes (Signed)
Adrienne Burch is a 37 y.o. female here for a new problem.  History of Present Illness:   Chief Complaint  Patient presents with   Cough    Pt c/o sore throat, runny nose and body aches started yesterday, low grade temp. COVID test on Friday was Negative, symptoms worsened yesterday. Took Dayquil & Nyquil yesterday.    Cough    URI Been feeling off for about week with fatigue and nausea Called out Friday to rest, Saturday felt improved Sunday midday -- sudden onset runny nose, sore throat, body aches  Yesterday took Dayquil and Nyquil   No LMP recorded. (Menstrual status: IUD).  Denies: vomiting, chest pain, SOB, LE swelling   Past Medical History:  Diagnosis Date   ADHD (attention deficit hyperactivity disorder)    Nevus    2 moles removed     Social History   Tobacco Use   Smoking status: Never   Smokeless tobacco: Never  Vaping Use   Vaping Use: Never used  Substance Use Topics   Alcohol use: Yes    Comment: once per year   Drug use: Never    Past Surgical History:  Procedure Laterality Date   WISDOM TOOTH EXTRACTION     20$ 18, 2019 half done each time    Family History  Problem Relation Age of Onset   Depression Mother    High Cholesterol Mother    Colon cancer Father 1   High blood pressure Father    Alcohol abuse Sister    Crohn's disease Sister    Arthritis Maternal Grandmother    High Cholesterol Maternal Grandmother    Alcohol abuse Maternal Grandfather    Heart disease Paternal Grandmother    High blood pressure Paternal Grandmother    Stroke Paternal Grandmother    Lung cancer Paternal Grandfather     Allergies  Allergen Reactions   Oxycodone Nausea And Vomiting   Pertussis Vaccines Other (See Comments)    Happened as a child, listless and high fever    Current Medications:   Current Outpatient Medications:    dexmethylphenidate (FOCALIN XR) 10 MG 24 hr capsule, Take 1 capsule (10 mg total) by mouth daily., Disp: 90  capsule, Rfl: 0   levonorgestrel (MIRENA, 52 MG,) 20 MCG/DAY IUD, 1 each by Intrauterine route once. Inserted by GYN in 08/2019 needs to be removed in 7 yrs 08/2026, Disp: , Rfl:    Multiple Vitamin (MULTIVITAMIN) tablet, Take 1 tablet by mouth daily., Disp: , Rfl:    Review of Systems:   Review of Systems  Respiratory:  Positive for cough.    Negative unless otherwise specified per HPI.   Vitals:   Vitals:   07/19/22 1124  BP: 126/80  Pulse: 68  Temp: 97.8 F (36.6 C)  TempSrc: Temporal  SpO2: 97%  Height: 5' (1.524 m)     There is no height or weight on file to calculate BMI.  Physical Exam:   Physical Exam Vitals and nursing note reviewed.  Constitutional:      General: She is not in acute distress.    Appearance: She is well-developed. She is not ill-appearing or toxic-appearing.  HENT:     Head: Normocephalic and atraumatic.     Right Ear: Tympanic membrane, ear canal and external ear normal. Tympanic membrane is not erythematous, retracted or bulging.     Left Ear: Tympanic membrane, ear canal and external ear normal. Tympanic membrane is not erythematous, retracted or bulging.     Nose:  Nose normal.     Right Sinus: No maxillary sinus tenderness or frontal sinus tenderness.     Left Sinus: No maxillary sinus tenderness or frontal sinus tenderness.     Mouth/Throat:     Pharynx: Uvula midline. No posterior oropharyngeal erythema.  Eyes:     General: Lids are normal.     Conjunctiva/sclera: Conjunctivae normal.  Neck:     Trachea: Trachea normal.  Cardiovascular:     Rate and Rhythm: Normal rate and regular rhythm.     Heart sounds: Normal heart sounds, S1 normal and S2 normal.  Pulmonary:     Effort: Pulmonary effort is normal.     Breath sounds: Normal breath sounds. No decreased breath sounds, wheezing, rhonchi or rales.  Lymphadenopathy:     Cervical: No cervical adenopathy.  Skin:    General: Skin is warm and dry.  Neurological:     Mental Status: She  is alert.  Psychiatric:        Speech: Speech normal.        Behavior: Behavior normal. Behavior is cooperative.    Results for orders placed or performed in visit on 07/19/22  POC Influenza A&B(BINAX/QUICKVUE)  Result Value Ref Range   Influenza A, POC Negative Negative   Influenza B, POC Negative Negative  POCT rapid strep A  Result Value Ref Range   Rapid Strep A Screen Negative Negative  POC COVID-19  Result Value Ref Range   SARS Coronavirus 2 Ag Negative Negative    Assessment and Plan:   Sore throat No red flags on exam.   All POC testing negative. Suspect viral URI Recommend supportive care, fluids and NSAIDs prn I also recommended she repeat COVID test in a day or two Reviewed return precautions including worsening fever, SOB, worsening cough or other concerns. Push fluids and rest. I recommend that patient follow-up if symptoms worsen or persist despite treatment x 7-10 days, sooner if needed.   Inda Coke, PA-C

## 2022-07-20 ENCOUNTER — Encounter: Payer: Self-pay | Admitting: Physician Assistant

## 2022-08-02 ENCOUNTER — Other Ambulatory Visit: Payer: Self-pay | Admitting: Physician Assistant

## 2022-08-02 MED ORDER — DEXMETHYLPHENIDATE HCL ER 10 MG PO CP24: 10.0000 mg | ORAL_CAPSULE | Freq: Every day | ORAL | 0 refills | Status: DC

## 2022-08-02 NOTE — Telephone Encounter (Signed)
Would like a [facility]ill [facility] [address]

## 2022-08-02 NOTE — Telephone Encounter (Signed)
Pt requesting refill for Focalin XR 10 mg. Last OV 04/16/2022.

## 2022-09-02 ENCOUNTER — Telehealth: Payer: Self-pay | Admitting: Physician Assistant

## 2022-09-02 ENCOUNTER — Other Ambulatory Visit: Payer: Self-pay | Admitting: Physician Assistant

## 2022-09-02 MED ORDER — DEXMETHYLPHENIDATE HCL ER 10 MG PO CP24: 10.0000 mg | ORAL_CAPSULE | Freq: Every day | ORAL | 0 refills | Status: DC

## 2022-09-02 NOTE — Telephone Encounter (Signed)
Last OV: 07/19/22 (Sick visit) Next OV: None Med: dexmethylphenidate (FOCALIN XR) 10 MG 24 hr capsule  [facility] [address]

## 2022-09-02 NOTE — Telephone Encounter (Signed)
Pt requesting refill for Focalin XR 10 mg. Last OV 07/19/2022 sick visit, then 04/26/2022.

## 2022-10-04 NOTE — Progress Notes (Signed)
Adrienne Burch is a 37 y.o. female here for a {New prob or follow up:31724}.  History of Present Illness:   No chief complaint on file.   HPI  Itchiness in leg: ***  Past Medical History:  Diagnosis Date   ADHD (attention deficit hyperactivity disorder)    Nevus    2 moles removed     Social History   Tobacco Use   Smoking status: Never   Smokeless tobacco: Never  Vaping Use   Vaping Use: Never used  Substance Use Topics   Alcohol use: Yes    Comment: once per year   Drug use: Never    Past Surgical History:  Procedure Laterality Date   WISDOM TOOTH EXTRACTION     2018, 2019 half done each time    Family History  Problem Relation Age of Onset   Depression Mother    High Cholesterol Mother    Colon cancer Father 77   High blood pressure Father    Alcohol abuse Sister    Crohn's disease Sister    Arthritis Maternal Grandmother    High Cholesterol Maternal Grandmother    Alcohol abuse Maternal Grandfather    Heart disease Paternal Grandmother    High blood pressure Paternal Grandmother    Stroke Paternal Grandmother    Lung cancer Paternal Grandfather     Allergies  Allergen Reactions   Oxycodone Nausea And Vomiting   Pertussis Vaccines Other (See Comments)    Happened as a child, listless and high fever    Current Medications:   Current Outpatient Medications:    dexmethylphenidate (FOCALIN XR) 10 MG 24 hr capsule, Take 1 capsule (10 mg total) by mouth daily., Disp: 90 capsule, Rfl: 0   levonorgestrel (MIRENA, 52 MG,) 20 MCG/DAY IUD, 1 each by Intrauterine route once. Inserted by GYN in 08/2019 needs to be removed in 7 yrs 08/2026, Disp: , Rfl:    Multiple Vitamin (MULTIVITAMIN) tablet, Take 1 tablet by mouth daily., Disp: , Rfl:    Review of Systems:   ROS  Vitals:   There were no vitals filed for this visit.   There is no height or weight on file to calculate BMI.  Physical Exam:   Physical Exam  Assessment and Plan:   There are  no diagnoses linked to this encounter.  I,Rachel Rivera,acting as a Neurosurgeon for [employer], PA.,have documented all relevant documentation on the behalf of Jarold Motto, PA,as directed by  Jarold Motto, PA while in the presence of Jarold Motto, [place].   ***  Jarold Motto, PA-C

## 2022-10-05 ENCOUNTER — Encounter: Payer: Self-pay | Admitting: Physician Assistant

## 2022-10-05 ENCOUNTER — Ambulatory Visit: Payer: 59 | Admitting: Physician Assistant

## 2022-10-05 VITALS — BP 120/84 | HR 80 | Temp 98.0°F | Ht 60.0 in

## 2022-10-05 DIAGNOSIS — M795 Residual foreign body in soft tissue: Secondary | ICD-10-CM | POA: Diagnosis not present

## 2022-10-06 NOTE — Addendum Note (Signed)
Addended by: Haynes Bast on: 10/06/2022 12:41 PM   Modules accepted: Level of Service

## 2022-10-13 ENCOUNTER — Other Ambulatory Visit: Payer: Self-pay | Admitting: Physician Assistant

## 2022-10-13 ENCOUNTER — Telehealth: Payer: Self-pay | Admitting: Physician Assistant

## 2022-10-13 MED ORDER — DEXMETHYLPHENIDATE HCL ER 10 MG PO CP24: 10.0000 mg | ORAL_CAPSULE | Freq: Every day | ORAL | 0 refills | Status: DC

## 2022-10-13 NOTE — Telephone Encounter (Signed)
Pt requesting refill for Focalin XR 10 mg, can only do 30 capsules at a time due to insurance. Last OV 10/05/2022.

## 2022-10-13 NOTE — Telephone Encounter (Signed)
Spoke to pt told her Rx was for 90 capsules, did you receive 90? Pt said no pharmacy can only dispense 30 at a time. Told her okay, I will make sure we send 30 capsules at a time. Pt verbalized understanding.

## 2022-10-13 NOTE — Telephone Encounter (Signed)
Prescription Request  10/13/2022  LOV: 10/05/2022  What is the name of the medication or equipment? dexmethylphenidate (FOCALIN XR) 10 MG 24 hr capsule   Have you contacted your pharmacy to request a refill? Yes   Which pharmacy would you like this sent to?   [id number] [place], [place] - [address] AT [facility] [address] [place] [address] Phone: [phone] Fax: [phone]    Patient notified that their request is being sent to the clinical staff for review and that they should receive a response within 2 business days.   Please advise at Mobile [phone] (mobile)

## 2022-10-13 NOTE — Telephone Encounter (Signed)
Noted  

## 2022-10-25 ENCOUNTER — Encounter: Payer: Self-pay | Admitting: Physician Assistant

## 2022-10-25 ENCOUNTER — Ambulatory Visit: Payer: 59 | Admitting: Physician Assistant

## 2022-10-25 VITALS — BP 110/80 | HR 84 | Temp 98.0°F | Ht 60.0 in

## 2022-10-25 DIAGNOSIS — R21 Rash and other nonspecific skin eruption: Secondary | ICD-10-CM

## 2022-10-25 MED ORDER — TRIAMCINOLONE ACETONIDE 0.1 % EX CREA: TOPICAL_CREAM | CUTANEOUS | 0 refills | Status: DC

## 2022-10-25 NOTE — Patient Instructions (Signed)
It was great to see you!  Take zyrtec daily and benadryl at night Start topical triamcinolone to calm down itch and help heal  If no improvement, let me know and I will send in oral steroids  Follow-up if ANY concerns  Take care,  Jarold Motto PA-C

## 2022-10-25 NOTE — Progress Notes (Signed)
Adrienne Burch is a 37 y.o. female here for a new problem.  History of Present Illness:   Chief Complaint  Patient presents with   Rash    Pt c/o rash on left arm started a week ago and is now spreading, itching. Was trimming hedges last week.    HPI  Rash She complains of itchiness, pain, and heat to touch on both arms starting about a week ago.  She was exposed to poison ivy while doing yard work on 5/11.  At the time she was unaware it was poison ivy, she saw 2 minor cuts on her left arm and applied Bacitracin.  She states the Bacitracin caused it to worsen.  She has also used hydrocortisone on her arm and has taken Zyrtec for allergies She is not taking any other oral medications to help.  Prior to learning about her exposure to poison ivy she was scratching often and has since avoided scratching.  Denies any discharge or fever. She notes she has not had poison ivy before.    Denies fevers, chills, blisters, discharge from area  Past Medical History:  Diagnosis Date   ADHD (attention deficit hyperactivity disorder)    Nevus    2 moles removed     Social History   Tobacco Use   Smoking status: Never   Smokeless tobacco: Never  Vaping Use   Vaping Use: Never used  Substance Use Topics   Alcohol use: Yes    Comment: once per year   Drug use: Never    Past Surgical History:  Procedure Laterality Date   WISDOM TOOTH EXTRACTION     2018, 2019 half done each time    Family History  Problem Relation Age of Onset   Depression Mother    High Cholesterol Mother    Colon cancer Father 39   High blood pressure Father    Alcohol abuse Sister    Crohn's disease Sister    Arthritis Maternal Grandmother    High Cholesterol Maternal Grandmother    Alcohol abuse Maternal Grandfather    Heart disease Paternal Grandmother    High blood pressure Paternal Grandmother    Stroke Paternal Grandmother    Lung cancer Paternal Grandfather     Allergies  Allergen  Reactions   Oxycodone Nausea And Vomiting   Pertussis Vaccines Other (See Comments)    Happened as a child, listless and high fever    Current Medications:   Current Outpatient Medications:    dexmethylphenidate (FOCALIN XR) 10 MG 24 hr capsule, Take 1 capsule (10 mg total) by mouth daily., Disp: 30 capsule, Rfl: 0   levonorgestrel (MIRENA, 52 MG,) 20 MCG/DAY IUD, 1 each by Intrauterine route once. Inserted by GYN in 08/2019 needs to be removed in 7 yrs 08/2026, Disp: , Rfl:    Multiple Vitamin (MULTIVITAMIN) tablet, Take 1 tablet by mouth daily., Disp: , Rfl:    Review of Systems:   Review of Systems  Skin:  Positive for itching and rash.    Vitals:   Vitals:   10/25/22 1351  BP: 110/80  Pulse: 84  Temp: 98 F (36.7 C)  TempSrc: Temporal  SpO2: 98%  Height: 5' (1.524 m)     There is no height or weight on file to calculate BMI.  Physical Exam:   Physical Exam Vitals and nursing note reviewed.  Constitutional:      General: She is not in acute distress.    Appearance: She is well-developed. She is  not ill-appearing or toxic-appearing.  Cardiovascular:     Rate and Rhythm: Normal rate and regular rhythm.     Pulses: Normal pulses.     Heart sounds: Normal heart sounds, S1 normal and S2 normal.  Pulmonary:     Effort: Pulmonary effort is normal.     Breath sounds: Normal breath sounds.  Skin:    General: Skin is warm and dry.     Comments: Diffuse area of erythema to left arm without discrete pustules or lesions  Neurological:     Mental Status: She is alert.     GCS: GCS eye subscore is 4. GCS verbal subscore is 5. GCS motor subscore is 6.  Psychiatric:        Speech: Speech normal.        Behavior: Behavior normal. Behavior is cooperative.     Assessment and Plan:   Rash and nonspecific skin eruption No red flags on exam History is concerning for plant irritant dermatitis Due to limited spread - will trial topical approach with triamcinolone - apply to  area 1-2 times daily Recommend also starting zyrtec daily If ongoing, or worsening, recommend reaching out and we will start oral systemic prednisone  Did discuss that unilateral appearance could be consistent with herpes zoster however she did identify poison ivy in her back yard and symptoms are most consistent with this   I,Rachel Rivera,acting as a scribe for [employer], PA.,have documented all relevant documentation on the behalf of Jarold Motto, PA,as directed by  Jarold Motto, PA while in the presence of Jarold Motto, [place].  I, Jarold Motto, [place], have reviewed all documentation for this visit. The documentation on 10/25/22 for the exam, diagnosis, procedures, and orders are all accurate and complete.   Jarold Motto, PA-C

## 2022-11-10 ENCOUNTER — Other Ambulatory Visit: Payer: Self-pay | Admitting: Physician Assistant

## 2022-11-10 NOTE — Telephone Encounter (Signed)
Prescription Request  11/10/2022  LOV: 10/25/2022  What is the name of the medication or equipment?  dexmethylphenidate (FOCALIN XR) 10 MG 24 hr capsule   Have you contacted your pharmacy to request a refill? Yes   Which pharmacy would you like this sent to? [id number] [place], [place] - [address] [place] [address] Phone: [phone] Fax: [phone]   Patient notified that their request is being sent to the clinical staff for review and that they should receive a response within 2 business days.   Please advise at Mobile [phone] (mobile)

## 2022-11-10 NOTE — Telephone Encounter (Signed)
Pt needs refill for Focalin XR 10 mg. Last OV 10/25/2022.

## 2022-11-15 NOTE — Telephone Encounter (Signed)
Patient requests to be called for status of RX request 11/10/22

## 2022-11-15 NOTE — Telephone Encounter (Signed)
Please call pt and schedule an appointment per Samantha. 

## 2022-11-16 NOTE — Progress Notes (Signed)
Adrienne Burch is a 37 y.o. female here for a medication refill.  History of Present Illness:   No chief complaint on file.   HPI     Past Medical History:  Diagnosis Date   ADHD (attention deficit hyperactivity disorder)    Nevus    2 moles removed     Social History   Tobacco Use   Smoking status: Never   Smokeless tobacco: Never  Vaping Use   Vaping Use: Never used  Substance Use Topics   Alcohol use: Yes    Comment: once per year   Drug use: Never    Past Surgical History:  Procedure Laterality Date   WISDOM TOOTH EXTRACTION     2018, 2019 half done each time    Family History  Problem Relation Age of Onset   Depression Mother    High Cholesterol Mother    Colon cancer Father 33   High blood pressure Father    Alcohol abuse Sister    Crohn's disease Sister    Arthritis Maternal Grandmother    High Cholesterol Maternal Grandmother    Alcohol abuse Maternal Grandfather    Heart disease Paternal Grandmother    High blood pressure Paternal Grandmother    Stroke Paternal Grandmother    Lung cancer Paternal Grandfather     Allergies  Allergen Reactions   Oxycodone Nausea And Vomiting   Pertussis Vaccines Other (See Comments)    Happened as a child, listless and high fever    Current Medications:   Current Outpatient Medications:    dexmethylphenidate (FOCALIN XR) 10 MG 24 hr capsule, Take 1 capsule (10 mg total) by mouth daily., Disp: 30 capsule, Rfl: 0   levonorgestrel (MIRENA, 52 MG,) 20 MCG/DAY IUD, 1 each by Intrauterine route once. Inserted by GYN in 08/2019 needs to be removed in 7 yrs 08/2026, Disp: , Rfl:    Multiple Vitamin (MULTIVITAMIN) tablet, Take 1 tablet by mouth daily., Disp: , Rfl:    triamcinolone cream (KENALOG) 0.1 %, Apply to affected area 1-2 times daily, Disp: 80 g, Rfl: 0   Review of Systems:   ROS  Vitals:   There were no vitals filed for this visit.   There is no height or weight on file to calculate  BMI.  Physical Exam:   Physical Exam  Assessment and Plan:   ***   I,Alexander Ruley,acting as a scribe for Jarold Motto, PA.,have documented all relevant documentation on the behalf of Jarold Motto, PA,as directed by  Jarold Motto, PA while in the presence of Jarold Motto, [place].   ***   Jarold Motto, PA-C

## 2022-11-17 ENCOUNTER — Ambulatory Visit: Payer: 59 | Admitting: Physician Assistant

## 2022-11-17 ENCOUNTER — Encounter: Payer: Self-pay | Admitting: Physician Assistant

## 2022-11-17 VITALS — BP 124/80 | HR 87 | Temp 98.2°F | Ht 60.0 in

## 2022-11-17 DIAGNOSIS — F909 Attention-deficit hyperactivity disorder, unspecified type: Secondary | ICD-10-CM | POA: Diagnosis not present

## 2022-11-17 MED ORDER — DEXMETHYLPHENIDATE HCL ER 10 MG PO CP24: 10.0000 mg | ORAL_CAPSULE | ORAL | 0 refills | Status: DC

## 2022-12-23 ENCOUNTER — Telehealth: Payer: 59 | Admitting: Physician Assistant

## 2022-12-23 ENCOUNTER — Encounter: Payer: Self-pay | Admitting: Physician Assistant

## 2022-12-23 VITALS — Temp 98.6°F | Ht 60.0 in

## 2022-12-23 DIAGNOSIS — U071 COVID-19: Secondary | ICD-10-CM | POA: Diagnosis not present

## 2022-12-23 MED ORDER — DEXMETHYLPHENIDATE HCL ER 10 MG PO CP24: 10.0000 mg | ORAL_CAPSULE | ORAL | 0 refills | Status: DC

## 2022-12-23 NOTE — Progress Notes (Signed)
Virtual Visit via Video   I connected with Adrienne Burch on 12/23/22 at 11:20 AM EDT by a video enabled telemedicine application and verified that I am speaking with the correct person using two identifiers. Location patient: Home Location provider: [place] [facility], Office Persons participating in the virtual visit: Adrienne Burch, [place] PA-C, Adrienne Mull, LPN   I discussed the limitations of evaluation and management by telemedicine and the availability of in person appointments. The patient expressed understanding and agreed to proceed.  I acted as a Neurosurgeon for Adrien[employer], PA-C [employer], LPN   Subjective:   HPI:   COVID-19 Positive Symptom onset: Tuesday night 7/16 Travel/contacts: unknown, works at [employer]  Vaccination status: 5 vaccines Testing results: Home test positive Wed. Went to [facility]  had test done again was positive. She has never had COVID  Patient endorses the following symptoms: sore throat, congestion, runny nose, headache, cough, fever 98.6 oral done now, fatigue. Patient denies the following symptoms: Denies chest pain, SHORTNESS OF BREATH.  Treatments tried: Has not taken anything.    ROS: See pertinent positives and negatives per HPI.  Patient Active Problem List   Diagnosis Date Noted   ADHD 04/22/2021    Social History   Tobacco Use   Smoking status: Never   Smokeless tobacco: Never  Substance Use Topics   Alcohol use: Yes    Comment: once per year    Current Outpatient Medications:    levonorgestrel (MIRENA, 52 MG,) 20 MCG/DAY IUD, 1 each by Intrauterine route once. Inserted by GYN in 08/2019 needs to be removed in 7 yrs 08/2026, Disp: , Rfl:    Multiple Vitamin (MULTIVITAMIN) tablet, Take 1 tablet by mouth daily., Disp: , Rfl:    triamcinolone cream (KENALOG) 0.1 %, Apply to affected area 1-2 times daily (Patient taking differently: Apply 1 Application topically as needed. Apply to affected area 1-2  times daily), Disp: 80 g, Rfl: 0   dexmethylphenidate (FOCALIN XR) 10 MG 24 hr capsule, Take 1 capsule (10 mg total) by mouth every morning., Disp: 30 capsule, Rfl: 0  Allergies  Allergen Reactions   Other Rash    Mango skin gets a rash, Mussels gets diarrhea   Oxycodone Nausea And Vomiting   Pertussis Vaccines Other (See Comments)    Happened as a child, listless and high fever    Objective:   VITALS: Per patient if applicable, see vitals. GENERAL: Alert, appears well and in no acute distress. HEENT: Atraumatic, conjunctiva clear, no obvious abnormalities on inspection of external nose and ears. NECK: Normal movements of the head and neck. CARDIOPULMONARY: No increased WOB. Speaking in clear sentences. I:E ratio WNL.  MS: Moves all visible extremities without noticeable abnormality. PSYCH: Pleasant and cooperative, well-groomed. Speech normal rate and rhythm. Affect is appropriate. Insight and judgement are appropriate. Attention is focused, linear, and appropriate.  NEURO: CN grossly intact. Oriented as arrived to appointment on time with no prompting. Moves both UE equally.  SKIN: No obvious lesions, wounds, erythema, or cyanosis noted on face or hands.  Assessment and Plan:   Adrienne Burch was seen today for covid positive.  Diagnoses and all orders for this visit:  COVID-19  Other orders -     dexmethylphenidate (FOCALIN XR) 10 MG 24 hr capsule; Take 1 capsule (10 mg total) by mouth every morning.    No red flags on discussion, patient is not in any obvious distress during our visit. Discussed progression of most viral illnesses, and  recommended supportive care at this point in time.   Discussed over the counter supportive care options, including Tylenol and NSAIDs, with recommendations to push fluids and rest.  Reviewed return precautions including new/worsening fever, SOB, new/worsening cough, sudden onset changes of symptoms. Recommended need to self-quarantine and practice  social distancing until symptoms resolve. I recommend that patient follow-up if symptoms worsen or persist despite treatment x 7-10 days, sooner if needed.  I discussed the assessment and treatment plan with the patient. The patient was provided an opportunity to ask questions and all were answered. The patient agreed with the plan and demonstrated an understanding of the instructions.   The patient was advised to call back or seek an in-person evaluation if the symptoms worsen or if the condition fails to improve as anticipated.  CMA or LPN served as scribe during this visit. History, Physical, and Plan performed by medical provider. The above documentation has been reviewed and is accurate and complete.   [place], [place] 12/23/2022

## 2023-02-02 ENCOUNTER — Other Ambulatory Visit: Payer: Self-pay | Admitting: Physician Assistant

## 2023-02-02 NOTE — Telephone Encounter (Signed)
Prescription Request  02/02/2023  LOV: 11/17/2022  What is the name of the medication or equipment?  dexmethylphenidate (FOCALIN XR) 10 MG 24 hr capsule   Have you contacted your pharmacy to request a refill? No   Which pharmacy would you like this sent to? [id number] [place], [place] - [address] [place] [address] Phone: [phone] Fax: [phone]   Patient notified that their request is being sent to the clinical staff for review and that they should receive a response within 2 business days.   Please advise at Mobile [phone] (mobile)

## 2023-02-03 ENCOUNTER — Other Ambulatory Visit: Payer: Self-pay | Admitting: Physician Assistant

## 2023-02-03 MED ORDER — DEXMETHYLPHENIDATE HCL ER 10 MG PO CP24: 10.0000 mg | ORAL_CAPSULE | ORAL | 0 refills | Status: DC

## 2023-02-03 NOTE — Telephone Encounter (Signed)
Pt requesting refill for Focalin XR 10 mg. Last OV 11/2022.

## 2023-06-09 ENCOUNTER — Other Ambulatory Visit: Payer: Self-pay | Admitting: Physician Assistant

## 2023-06-09 MED ORDER — DEXMETHYLPHENIDATE HCL ER 10 MG PO CP24: 10.0000 mg | ORAL_CAPSULE | ORAL | 0 refills | Status: DC

## 2023-06-09 NOTE — Telephone Encounter (Signed)
 Copied from CRM [id number]. Topic: Clinical - Medication Refill >> Jun 09, 2023  8:47 AM Robinson DEL wrote: Most Recent Primary Care Visit:  Provider: WORLEY, SAMANTHA  Department: [facility]  Visit Type: MYCHART VIDEO VISIT  Date: 12/23/2022  Medication: dexmethylphenidate  (FOCALIN  XR) 10 MG 24 hr capsule   Has the patient contacted their pharmacy? No, Controlled subsatance (Agent: If no, request that the patient contact the pharmacy for the refill. If patient does not wish to contact the pharmacy document the reason why and proceed with request.) (Agent: If yes, when and what did the pharmacy advise?)  Is this the correct pharmacy for this prescription? Yes If no, delete pharmacy and type the correct one.  This is the patient's preferred pharmacy:  [phone] [place]  - [address] [place] [address] Phone: [phone] Fax: [phone]    Has the prescription been filled recently? No  Is the patient out of the medication? No  Has the patient been seen for an appointment in the last year OR does the patient have an upcoming appointment? Yes  Can we respond through MyChart? Yes  Agent: Please be advised that Rx refills may take up to 3 business days. We ask that you follow-up with your pharmacy.

## 2023-06-09 NOTE — Telephone Encounter (Signed)
 Copied from CRM [id number]. Topic: Clinical - Medication Refill >> Jun 09, 2023  8:47 AM Robinson DEL wrote: Most Recent Primary Care Visit:  Provider: JOB LUKES  Department: [facility]  Visit Type: MYCHART VIDEO VISIT  Date: 12/23/2022  Medication: ***  Has the patient contacted their pharmacy?  (Agent: If no, request that the patient contact the pharmacy for the refill. If patient does not wish to contact the pharmacy document the reason why and proceed with request.) (Agent: If yes, when and what did the pharmacy advise?)  Is this the correct pharmacy for this prescription?  If no, delete pharmacy and type the correct one.  This is the patient's preferred pharmacy:  [id number] [place], [place] - [address] [place] [address] Phone: [phone] Fax: [phone]  [facility] - [place], [place] - [address] [place] [address] Phone: [phone] Fax: [phone]  [id number] - [place], [place] - [address] AT [facility] [address] [place] [address] Phone: [phone] Fax: [phone]  [id number] [place], [place] - [phone] [facility] [address] [place] [address] Phone: [phone] Fax: [phone]  [id number] [place], [place] - [address] [place] [address] Phone: [phone] Fax: [phone]  [id number] [place], [place] - [address] [facility] [address] [place] [address] Phone: [phone] Fax: [phone]   Has the prescription been filled recently?   Is the patient out of the medication?   Has the patient been seen for an appointment in the last year OR does the patient have an upcoming appointment?   Can we respond through MyChart?   Agent:  Please be advised that Rx refills may take up to 3 business days. We ask that you follow-up with your pharmacy.

## 2023-06-09 NOTE — Telephone Encounter (Signed)
 Pt requesting refill for Focalin XR 10 mg. Last OV 12/23/2022.

## 2023-07-04 ENCOUNTER — Encounter: Payer: Self-pay | Admitting: Physician Assistant

## 2023-07-04 ENCOUNTER — Ambulatory Visit (INDEPENDENT_AMBULATORY_CARE_PROVIDER_SITE_OTHER): Payer: 59 | Admitting: Physician Assistant

## 2023-07-04 VITALS — BP 122/80 | HR 72 | Temp 98.1°F | Ht 60.0 in

## 2023-07-04 DIAGNOSIS — E785 Hyperlipidemia, unspecified: Secondary | ICD-10-CM

## 2023-07-04 DIAGNOSIS — Z0000 Encounter for general adult medical examination without abnormal findings: Secondary | ICD-10-CM

## 2023-07-04 DIAGNOSIS — Z1159 Encounter for screening for other viral diseases: Secondary | ICD-10-CM | POA: Diagnosis not present

## 2023-07-04 DIAGNOSIS — F909 Attention-deficit hyperactivity disorder, unspecified type: Secondary | ICD-10-CM | POA: Diagnosis not present

## 2023-07-04 DIAGNOSIS — E669 Obesity, unspecified: Secondary | ICD-10-CM

## 2023-07-04 MED ORDER — DEXMETHYLPHENIDATE HCL ER 10 MG PO CP24: 10.0000 mg | ORAL_CAPSULE | ORAL | 0 refills | Status: DC

## 2023-07-04 NOTE — Patient Instructions (Signed)
It was great to see you!  Please go to the lab for blood work.   Our office will call you with your results unless you have chosen to receive results via MyChart.  If your blood work is normal we will follow-up each year for physicals and as scheduled for chronic medical problems.  If anything is abnormal we will treat accordingly and get you in for a follow-up.  Take care,  Lelon Mast

## 2023-07-04 NOTE — Progress Notes (Signed)
Subjective:    Adrienne Burch is a 38 y.o. female and is here for a comprehensive physical exam.  HPI  Health Maintenance Due  Topic Date Due   Hepatitis C Screening  Never done    Acute Concerns: None  Chronic Issues: ADHD Well controlled with Focalin XR 10 mg daily. Tolerates this well.  No other concerns or symptoms at this time.   Insomnia screening/Mild sleep apnea  She states that she underwent an insomnia screening following an orthodontist's exam that revealed narrowed airways. She doesn't particularly feel the data was accurate, however screening indicated a very mild sleep apnea.  She does reports snoring.   Health Maintenance: Immunizations -- N/A Colonoscopy -- N/A Mammogram -- N/A PAP -- Last done on 08-14-20. Normal  Bone Density -- N/A Diet -- healthy, well balanced diet Exercise -- typically on her feat during work, occasional hikes, biking at home   Sleep habits -- no complains, snoring  Mood -- has been experiencing some stress due to work  UTD with dentist? - yes UTD with eye doctor? - yes  Weight history: Wt Readings from Last 10 Encounters:  No data found for Wt   There is no height or weight on file to calculate BMI. No LMP recorded. (Menstrual status: IUD).  Alcohol use:  reports current alcohol use.  Tobacco use:  Tobacco Use: Low Risk  (07/04/2023)   Patient History    Smoking Tobacco Use: Never    Smokeless Tobacco Use: Never    Passive Exposure: Not on file   Eligible for lung cancer screening? no     07/04/2023    2:10 PM  Depression screen PHQ 2/9  Decreased Interest 1  Down, Depressed, Hopeless 1  PHQ - 2 Score 2  Altered sleeping 1  Tired, decreased energy 1  Change in appetite 0  Feeling bad or failure about yourself  0  Trouble concentrating 1  Moving slowly or fidgety/restless 0  Suicidal thoughts 0  PHQ-9 Score 5  Difficult doing work/chores Somewhat difficult     Other  providers/specialists: Patient Care Team: Jarold Motto, [place] as PCP - General (Physician Assistant)    PMHx, SurgHx, SocialHx, Medications, and Allergies were reviewed in the Visit Navigator and updated as appropriate.   Past Medical History:  Diagnosis Date   ADHD (attention deficit hyperactivity disorder)    Allergy Unsure   itchy eyes, runny nose when pollen is very high. minimal reaction.   Anemia Unsure   Slightly anemic   Depression Unsure   Seasonal, doesn't happen every year.   Nevus    2 moles removed   Sleep apnea Oct 2024   minimal, treatment optional according to specialist     Past Surgical History:  Procedure Laterality Date   WISDOM TOOTH EXTRACTION     2018, 2019 half done each time     Family History  Problem Relation Age of Onset   Depression Mother    High Cholesterol Mother    Colon cancer Father 35   High blood pressure Father    Alcohol abuse Sister    Crohn's disease Sister    Depression Sister    Arthritis Maternal Grandmother    High Cholesterol Maternal Grandmother    Hearing loss Maternal Grandmother    Alcohol abuse Maternal Grandfather    Heart disease Paternal Grandmother    High blood pressure Paternal Grandmother    Stroke Paternal Grandmother    Arthritis Paternal Grandmother    Lung  cancer Paternal Grandfather    Colon cancer Maternal Aunt 50   Lung cancer Maternal Uncle     Social History   Tobacco Use   Smoking status: Never   Smokeless tobacco: Never  Vaping Use   Vaping status: Never Used  Substance Use Topics   Alcohol use: Yes    Comment: once per year   Drug use: Never    Review of Systems:   Review of Systems  Constitutional:  Negative for chills, fever, malaise/fatigue and weight loss.  HENT:  Negative for hearing loss, sinus pain and sore throat.   Respiratory:  Negative for cough and hemoptysis.   Cardiovascular:  Negative for chest pain, palpitations, leg swelling and PND.  Gastrointestinal:   Negative for abdominal pain, constipation, diarrhea, heartburn, nausea and vomiting.  Genitourinary:  Negative for dysuria, frequency and urgency.  Musculoskeletal:  Negative for back pain, myalgias and neck pain.  Skin:  Negative for itching and rash.  Neurological:  Negative for dizziness, tingling, seizures and headaches.  Endo/Heme/Allergies:  Negative for polydipsia.  Psychiatric/Behavioral:  Negative for depression. The patient is nervous/anxious.     Objective:   BP 122/80 (BP Location: Left Arm, Patient Position: Sitting, Cuff Size: Large)   Pulse 72   Temp 98.1 F (36.7 C) (Temporal)   Ht 5' (1.524 m)   SpO2 99%  There is no height or weight on file to calculate BMI.   General Appearance:    Alert, cooperative, no distress, appears stated age  Head:    Normocephalic, without obvious abnormality, atraumatic  Eyes:    PERRL, conjunctiva/corneas clear, EOM's intact, fundi    benign, both eyes  Ears:    Normal TM's and external ear canals, both ears  Nose:   Nares normal, septum midline, mucosa normal, no drainage    or sinus tenderness  Throat:   Lips, mucosa, and tongue normal; teeth and gums normal  Neck:   Supple, symmetrical, trachea midline, no adenopathy;    thyroid:  no enlargement/tenderness/nodules; no carotid   bruit or JVD  Back:     Symmetric, no curvature, ROM normal, no CVA tenderness  Lungs:     Clear to auscultation bilaterally, respirations unlabored  Chest Wall:    No tenderness or deformity   Heart:    Regular rate and rhythm, S1 and S2 normal, no murmur, rub or gallop  Breast Exam:    Deferred   Abdomen:     Soft, non-tender, bowel sounds active all four quadrants,    no masses, no organomegaly  Genitalia:    Deferred   Extremities:   Extremities normal, atraumatic, no cyanosis or edema  Pulses:   2+ and symmetric all extremities  Skin:   Skin color, texture, turgor normal, no rashes or lesions  Lymph nodes:   Cervical, supraclavicular, and axillary  nodes normal  Neurologic:   CNII-XII intact, normal strength, sensation and reflexes    throughout    Assessment/Plan:   Routine physical examination Today patient counseled on age appropriate routine health concerns for screening and prevention, each reviewed and up to date or declined. Immunizations reviewed and up to date or declined. Labs ordered and reviewed. Risk factors for depression reviewed and negative. Hearing function and visual acuity are intact. ADLs screened and addressed as needed. Functional ability and level of safety reviewed and appropriate. Education, counseling and referrals performed based on assessed risks today. Patient provided with a copy of personalized plan for preventive services.  Attention deficit hyperactivity  disorder (ADHD), unspecified ADHD type Well controlled Prescription drug monitoring program reviewed without red flags Continue focalin 10 mg XR daily  Encounter for screening for other viral diseases Update hepatitis C  Hyperlipidemia, unspecified hyperlipidemia type Update lipid panel and provide recommendations  Obesity, unspecified class, unspecified obesity type, unspecified whether serious comorbidity present Continue efforts at healthy lifestyle   Jarold Motto, PA-C [facility] M Kadhim,acting as a Neurosurgeon for [employer], PA.,have documented all relevant documentation on the behalf of Jarold Motto, PA,as directed by  Jarold Motto, PA while in the presence of Jarold Motto, [place].   I, Jarold Motto, [place], have reviewed all documentation for this visit. The documentation on 07/04/23 for the exam, diagnosis, procedures, and orders are all accurate and complete.

## 2023-09-06 ENCOUNTER — Other Ambulatory Visit: Payer: Self-pay | Admitting: Physician Assistant

## 2023-09-06 MED ORDER — DEXMETHYLPHENIDATE HCL ER 10 MG PO CP24: 10.0000 mg | ORAL_CAPSULE | ORAL | 0 refills | Status: DC

## 2023-09-06 NOTE — Telephone Encounter (Signed)
 LOV:07/04/2023 LAST FILLED:07/04/2023 NEXT OV:01/06/2024

## 2023-09-06 NOTE — Telephone Encounter (Signed)
 Copied from CRM [id number]. Topic: Clinical - Medication Refill >> Sep 06, 2023  8:41 AM Mayer Masker wrote: Most Recent Primary Care Visit:  Provider: Jarold Motto  Department: [facility]  Visit Type: PHYSICAL  Date: 07/04/2023  Medication: dexmethylphenidate (FOCALIN XR) 10 MG 24 hr capsule [[id number]]  Has the patient contacted their pharmacy? Yes (Agent: If no, request that the patient contact the pharmacy for the refill. If patient does not wish to contact the pharmacy document the reason why and proceed with request.) (Agent: If yes, when and what did the pharmacy advise?)  Is this the correct pharmacy for this prescription? Yes If no, delete pharmacy and type the correct one.  This is the patient's preferred pharmacy:   [phone] G[place] Otto, [place] - [address] [place] [address] Phone: [phone] Fax: [phone]     Has the prescription been filled recently? Yes  Is the patient out of the medication? Yes  Has the patient been seen for an appointment in the last year OR does the patient have an upcoming appointment? Yes  Can we respond through MyChart? Yes  Agent: Please be advised that Rx refills may take up to 3 business days. We ask that you follow-up with your pharmacy.

## 2023-10-05 ENCOUNTER — Other Ambulatory Visit: Payer: Self-pay | Admitting: Physician Assistant

## 2023-10-05 MED ORDER — DEXMETHYLPHENIDATE HCL ER 10 MG PO CP24: 10.0000 mg | ORAL_CAPSULE | ORAL | 0 refills | Status: DC

## 2023-10-05 NOTE — Telephone Encounter (Signed)
 Copied from CRM [id number]. Topic: Clinical - Medication Refill >> Oct 05, 2023  9:05 AM Annelle Kiel wrote: Most Recent Primary Care Visit:  Provider: Alexander Iba  Department: [facility]  Visit Type: PHYSICAL  Date: 07/04/2023  Medication: dexmethylphenidate  (FOCALIN  XR) 10 MG 24 hr  Has the patient contacted their pharmacy? Yes (Agent: If no, request that the patient contact the pharmacy for the refill. If patient does not wish to contact the pharmacy document the reason why and proceed with request.) (Agent: If yes, when and what did the pharmacy advise?)  Is this the correct pharmacy for this prescription? Yes If no, delete pharmacy and type the correct one.  This is the patient's preferred pharmacy:   [id number] - [place], [place] - [address] AT [facility] OF [address] [place] [address] Phone: [phone] Fax: [phone]    Has the prescription been filled recently? No  Is the patient out of the medication? Yes  Has the patient been seen for an appointment in the last year OR does the patient have an upcoming appointment? Yes  Can we respond through MyChart? Yes  Agent: Please be advised that Rx refills may take up to 3 business days. We ask that you follow-up with your pharmacy.

## 2023-10-05 NOTE — Telephone Encounter (Signed)
 Pt requesting refill for Focalin  XR 10 mg. Last OV 06/2023.

## 2023-10-25 ENCOUNTER — Ambulatory Visit: Admitting: Physician Assistant

## 2023-10-25 ENCOUNTER — Encounter: Payer: Self-pay | Admitting: Physician Assistant

## 2023-10-25 VITALS — BP 122/80 | HR 88 | Temp 97.5°F | Ht 60.0 in

## 2023-10-25 DIAGNOSIS — J3489 Other specified disorders of nose and nasal sinuses: Secondary | ICD-10-CM

## 2023-10-25 DIAGNOSIS — F909 Attention-deficit hyperactivity disorder, unspecified type: Secondary | ICD-10-CM

## 2023-10-25 MED ORDER — FLUTICASONE PROPIONATE 50 MCG/ACT NA SUSP: 2.0000 | Freq: Every day | NASAL | 1 refills | Status: DC

## 2023-10-25 MED ORDER — AMOXICILLIN-POT CLAVULANATE 875-125 MG PO TABS: 1.0000 | ORAL_TABLET | Freq: Two times a day (BID) | ORAL | 0 refills | Status: DC

## 2023-10-25 NOTE — Progress Notes (Signed)
 Adrienne Burch is a 38 y.o. female here for a new problem.  History of Present Illness:   Chief Complaint  Patient presents with   Sinus Problem    Pt c/o sinus pressure and pain, worse right side of nose and sensitive to touch, yellow nasal drainage, headaches. Denies fever or chills. Has been taking Allergy medication and Ibuprofen.    HPI  Sinus problem Pt complains of sinus pressure and pain. She reports bilateral nose pressure, worse in the right nostril and inside. She describes the pain as different from regular allergy pain. She normally has allergies and her regimen alternates Claritin and Zyrtec. She confirms pain, soreness, tenderness, and blowing her nose often with yellow/clear drainage. Pt states she does not think it's a cold/virus because the right nostril is suffering more than the left. She states both sides are similarly affected when she is sick. Endorses normal appetite and energy levels. Denies cough, sore throat, perfuse nose bleeds, sick contacts Denies trying a saline rinse, Neti pot, or a nasal spray recently.  ADHD Currently taking dexmethylphenidate  10 mg XR daily Tolerating well without any adverse side effect(s)   Past Medical History:  Diagnosis Date   ADHD (attention deficit hyperactivity disorder)    Allergy Unsure   itchy eyes, runny nose when pollen is very high. minimal reaction.   Anemia Unsure   Slightly anemic   Depression Unsure   Seasonal, doesn't happen every year.   Nevus    2 moles removed   Sleep apnea Oct 2024   minimal, treatment optional according to specialist     Social History   Tobacco Use   Smoking status: Never   Smokeless tobacco: Never  Vaping Use   Vaping status: Never Used  Substance Use Topics   Alcohol use: Yes    Comment: once per year   Drug use: Never    Past Surgical History:  Procedure Laterality Date   WISDOM TOOTH EXTRACTION     2018, 2019 half done each time    Family History   Problem Relation Age of Onset   Depression Mother    High Cholesterol Mother    Colon cancer Father 110   High blood pressure Father    Alcohol abuse Sister    Crohn's disease Sister    Depression Sister    Arthritis Maternal Grandmother    High Cholesterol Maternal Grandmother    Hearing loss Maternal Grandmother    Alcohol abuse Maternal Grandfather    Heart disease Paternal Grandmother    High blood pressure Paternal Grandmother    Stroke Paternal Grandmother    Arthritis Paternal Grandmother    Lung cancer Paternal Grandfather    Colon cancer Maternal Aunt 50   Lung cancer Maternal Uncle     Allergies  Allergen Reactions   Other Rash    Mango skin gets a rash, Mussels gets diarrhea   Oxycodone Nausea And Vomiting   Pertussis Vaccines Other (See Comments)    Happened as a child, listless and high fever    Current Medications:   Current Outpatient Medications:    amoxicillin-clavulanate (AUGMENTIN) 875-125 MG tablet, Take 1 tablet by mouth 2 (two) times daily., Disp: 14 tablet, Rfl: 0   cetirizine (ZYRTEC) 10 MG tablet, Take 10 mg by mouth daily. Alternates with Claritin, Disp: , Rfl:    dexmethylphenidate  (FOCALIN  XR) 10 MG 24 hr capsule, Take 1 capsule (10 mg total) by mouth every morning., Disp: 30 capsule, Rfl: 0   [START  ON 11/04/2023] dexmethylphenidate  (FOCALIN  XR) 10 MG 24 hr capsule, Take 1 capsule (10 mg total) by mouth every morning., Disp: 30 capsule, Rfl: 0   [START ON 12/04/2023] dexmethylphenidate  (FOCALIN  XR) 10 MG 24 hr capsule, Take 1 capsule (10 mg total) by mouth every morning., Disp: 30 capsule, Rfl: 0   fluticasone (FLONASE) 50 MCG/ACT nasal spray, Place 2 sprays into both nostrils daily., Disp: 16 g, Rfl: 1   ibuprofen (ADVIL) 200 MG tablet, Take 400 mg by mouth every 6 (six) hours as needed., Disp: , Rfl:    levonorgestrel (MIRENA, 52 MG,) 20 MCG/DAY IUD, 1 each by Intrauterine route once. Inserted by GYN in 08/2019 needs to be removed in 7 yrs 08/2026,  Disp: , Rfl:    loratadine (CLARITIN) 10 MG tablet, Take 10 mg by mouth daily. Alternates with Zyrtec, Disp: , Rfl:    Multiple Vitamin (MULTIVITAMIN) tablet, Take 1 tablet by mouth daily., Disp: , Rfl:    dexmethylphenidate  (FOCALIN  XR) 10 MG 24 hr capsule, Take 1 capsule (10 mg total) by mouth every morning., Disp: 30 capsule, Rfl: 0   Review of Systems:   Negative unless otherwise specified per HPI.  Vitals:   Vitals:   10/25/23 0817  BP: 122/80  Pulse: 88  Temp: (!) 97.5 F (36.4 C)  TempSrc: Temporal  SpO2: 98%  Height: 5' (1.524 m)     There is no height or weight on file to calculate BMI.  Physical Exam:   Physical Exam Vitals and nursing note reviewed.  Constitutional:      General: She is not in acute distress.    Appearance: She is well-developed. She is not ill-appearing or toxic-appearing.  HENT:     Nose:     Right Turbinates: Swollen.     Right Sinus: Maxillary sinus tenderness present. No frontal sinus tenderness.     Left Sinus: No maxillary sinus tenderness or frontal sinus tenderness.  Cardiovascular:     Rate and Rhythm: Normal rate and regular rhythm.     Pulses: Normal pulses.     Heart sounds: Normal heart sounds, S1 normal and S2 normal.  Pulmonary:     Effort: Pulmonary effort is normal.     Breath sounds: Normal breath sounds.  Skin:    General: Skin is warm and dry.  Neurological:     Mental Status: She is alert.     GCS: GCS eye subscore is 4. GCS verbal subscore is 5. GCS motor subscore is 6.  Psychiatric:        Speech: Speech normal.        Behavior: Behavior normal. Behavior is cooperative.     Assessment and Plan:   1. Sinus pressure (Primary) No red flags on exam.   Recommend starting daily antihistamine Recommend saline nasal spray and Flonase nasal spray in AM and PM Advised as follows: "If no improvement of nasal pressure OR any worsening pressure/new fever -- please start the antibiotic(s)  If still no improvement, let  me know"  Discussed taking medications as prescribed.  Reviewed return precautions including new or worsening fever, SOB, new or worsening cough or other concerns.  Push fluids and rest.  I recommend that patient follow-up if symptoms worsen or persist despite treatment x 7-10 days, sooner if needed.  2. Attention deficit hyperactivity disorder (ADHD), unspecified ADHD type Well controlled Denies side effect(s) of medications PDMP reviewed during this encounter. Continue focalin  XR 10 mg daily  I, Timoteo Force, acting as a Neurosurgeon  for Alexander Iba, [place]., have documented all relevant documentation on the behalf of Alexander Iba, [place], as directed by  Alexander Iba, PA while in the presence of Alexander Iba, [place].  I, Alexander Iba, [place], have reviewed all documentation for this visit. The documentation on 10/25/23 for the exam, diagnosis, procedures, and orders are all accurate and complete.  Alexander Iba, PA-C

## 2023-10-25 NOTE — Patient Instructions (Addendum)
 It was great to see you!  Start regular use of over the counter antihistamines such as Zyrtec (cetirizine), Claritin (loratadine), Allegra (fexofenadine), or Xyzal (levocetirizine) daily.  Start nasal saline spray (i.e., Simply Saline) or nasal saline lavage (i.e., NeilMed) is recommended as needed and prior to medicated nasal sprays.  Start Flonase 1 spray twice a day.  If no improvement of nasal pressure OR any worsening pressure/new fever -- please start the antibiotic(s)   If still no improvement, let me know   Take care,  Alexander Iba PA-C

## 2023-11-07 ENCOUNTER — Other Ambulatory Visit: Payer: Self-pay | Admitting: Physician Assistant

## 2023-11-07 NOTE — Telephone Encounter (Unsigned)
 Copied from CRM [id number]. Topic: Clinical - Medication Refill >> Nov 07, 2023 10:20 AM Oddis Bench wrote: Medication: dexmethylphenidate  (FOCALIN  XR) 10 MG 24 hr capsule  Has the patient contacted their pharmacy? Yes To have PCP call in  This is the patient's preferred pharmacy:  [id number] [place] Nestle, [place] - [address] [place] [address] Phone: [phone] Fax: [phone]    Is this the correct pharmacy for this prescription? Yes If no, delete pharmacy and type the correct one.   Has the prescription been filled recently? Yes  Is the patient out of the medication? No  Has the patient been seen for an appointment in the last year OR does the patient have an upcoming appointment? Yes  Can we respond through MyChart? Yes  Agent: Please be advised that Rx refills may take up to 3 business days. We ask that you follow-up with your pharmacy.

## 2023-11-07 NOTE — Telephone Encounter (Signed)
 Pt requesting refill for Focalin  XR 10 mg capsule. Last OV 10/25/2023.

## 2023-11-17 ENCOUNTER — Other Ambulatory Visit: Payer: Self-pay | Admitting: Physician Assistant

## 2023-12-15 ENCOUNTER — Other Ambulatory Visit: Payer: Self-pay | Admitting: Physician Assistant

## 2023-12-15 NOTE — Telephone Encounter (Signed)
 Adrienne Burch, pt should have a refill at the pharmacy yet. Please deny.

## 2023-12-15 NOTE — Telephone Encounter (Signed)
 12/04/23 30 tabs/0 RF

## 2023-12-15 NOTE — Telephone Encounter (Unsigned)
 Copied from CRM [id number]. Topic: Clinical - Medication Refill >> Dec 15, 2023  8:46 AM Suzen RAMAN wrote: Medication: dexmethylphenidate  (FOCALIN  XR) 10 MG 24 hr capsule  Has the patient contacted their pharmacy? Yes  This is the patient's preferred pharmacy:  [phone] [place] M[place], [place] - [address] [place] [address] Phone: [phone] Fax: [phone]  Is this the correct pharmacy for this prescription? Yes If no, delete pharmacy and type the correct one.   Has the prescription been filled recently? No  Is the patient out of the medication? No  Has the patient been seen for an appointment in the last year OR does the patient have an upcoming appointment? Yes  Can we respond through MyChart? Yes  Agent: Please be advised that Rx refills may take up to 3 business days. We ask that you follow-up with your pharmacy.

## 2024-01-06 ENCOUNTER — Ambulatory Visit: Payer: 59 | Admitting: Physician Assistant

## 2024-01-06 VITALS — BP 114/76 | HR 78 | Temp 97.9°F | Ht 60.0 in

## 2024-01-06 DIAGNOSIS — E785 Hyperlipidemia, unspecified: Secondary | ICD-10-CM | POA: Diagnosis not present

## 2024-01-06 DIAGNOSIS — F909 Attention-deficit hyperactivity disorder, unspecified type: Secondary | ICD-10-CM

## 2024-01-06 DIAGNOSIS — Z0000 Encounter for general adult medical examination without abnormal findings: Secondary | ICD-10-CM | POA: Diagnosis not present

## 2024-01-06 DIAGNOSIS — Z1159 Encounter for screening for other viral diseases: Secondary | ICD-10-CM

## 2024-01-06 LAB — LIPID PANEL
Cholesterol: 198 mg/dL (ref 0–200)
HDL: 51.7 mg/dL (ref >39.00)
LDL Cholesterol: 129 mg/dL — ABNORMAL HIGH (ref 0–99)
NonHDL: 146.01
Total CHOL/HDL Ratio: 4
Triglycerides: 83 mg/dL (ref 0.0–149.0)
VLDL: 16.6 mg/dL (ref 0.0–40.0)

## 2024-01-06 LAB — CBC WITH DIFFERENTIAL/PLATELET
Basophils Absolute: 0 K/uL (ref 0.0–0.1)
Basophils Relative: 0.4 % (ref 0.0–3.0)
Eosinophils Absolute: 0.1 K/uL (ref 0.0–0.7)
Eosinophils Relative: 1.3 % (ref 0.0–5.0)
HCT: 38.2 % (ref 36.0–46.0)
Hemoglobin: 12.7 g/dL (ref 12.0–15.0)
Lymphocytes Relative: 21.9 % (ref 12.0–46.0)
Lymphs Abs: 2 K/uL (ref 0.7–4.0)
MCHC: 33.4 g/dL (ref 30.0–36.0)
MCV: 84.9 fl (ref 78.0–100.0)
Monocytes Absolute: 0.4 K/uL (ref 0.1–1.0)
Monocytes Relative: 4.8 % (ref 3.0–12.0)
Neutro Abs: 6.4 K/uL (ref 1.4–7.7)
Neutrophils Relative %: 71.6 % (ref 43.0–77.0)
Platelets: 249 K/uL (ref 150.0–400.0)
RBC: 4.5 Mil/uL (ref 3.87–5.11)
RDW: 13.5 % (ref 11.5–15.5)
WBC: 9 K/uL (ref 4.0–10.5)

## 2024-01-06 LAB — COMPREHENSIVE METABOLIC PANEL WITH GFR
ALT: 13 U/L (ref 0–35)
AST: 13 U/L (ref 0–37)
Albumin: 4.3 g/dL (ref 3.5–5.2)
Alkaline Phosphatase: 79 U/L (ref 39–117)
BUN: 9 mg/dL (ref 6–23)
CO2: 25 meq/L (ref 19–32)
Calcium: 8.8 mg/dL (ref 8.4–10.5)
Chloride: 101 meq/L (ref 96–112)
Creatinine, Ser: 0.58 mg/dL (ref 0.40–1.20)
GFR: 114.92 mL/min (ref >60.00)
Glucose, Bld: 75 mg/dL (ref 70–99)
Potassium: 3.7 meq/L (ref 3.5–5.1)
Sodium: 135 meq/L (ref 135–145)
Total Bilirubin: 0.9 mg/dL (ref 0.2–1.2)
Total Protein: 7.5 g/dL (ref 6.0–8.3)

## 2024-01-06 NOTE — Progress Notes (Signed)
 Adrienne Burch is a 38 y.o. female here for a follow up of a pre-existing problem.  History of Present Illness:   Chief Complaint  Patient presents with   Medical Management of Chronic Issues    Pt seen today for 6 month follow up; pt declined weight check today; no concerns or problems.    HPI  ADHD Currently taking focalin  XR 10 mg daily Tolerating well Reports there is no issue with insomnia, irritability, lack of focus  Past Medical History:  Diagnosis Date   ADHD (attention deficit hyperactivity disorder)    Allergy Unsure   itchy eyes, runny nose when pollen is very high. minimal reaction.   Anemia Unsure   Slightly anemic   Depression Unsure   Seasonal, doesn't happen every year.   Nevus    2 moles removed   Sleep apnea Oct 2024   minimal, treatment optional according to specialist     Social History   Tobacco Use   Smoking status: Never   Smokeless tobacco: Never  Vaping Use   Vaping status: Never Used  Substance Use Topics   Alcohol use: Yes    Comment: once per year   Drug use: Never    Past Surgical History:  Procedure Laterality Date   WISDOM TOOTH EXTRACTION     2018, 2019 half done each time    Family History  Problem Relation Age of Onset   Depression Mother    High Cholesterol Mother    Colon cancer Father 34   High blood pressure Father    Alcohol abuse Sister    Crohn's disease Sister    Depression Sister    Arthritis Maternal Grandmother    High Cholesterol Maternal Grandmother    Hearing loss Maternal Grandmother    Alcohol abuse Maternal Grandfather    Heart disease Paternal Grandmother    High blood pressure Paternal Grandmother    Stroke Paternal Grandmother    Arthritis Paternal Grandmother    Lung cancer Paternal Grandfather    Colon cancer Maternal Aunt 50   Lung cancer Maternal Uncle     Allergies  Allergen Reactions   Other Rash    Mango skin gets a rash, Mussels gets diarrhea   Oxycodone Nausea And  Vomiting   Pertussis Vaccines Other (See Comments)    Happened as a child, listless and high fever    Current Medications:   Current Outpatient Medications:    cetirizine (ZYRTEC) 10 MG tablet, Take 10 mg by mouth daily. Alternates with Claritin, Disp: , Rfl:    dexmethylphenidate  (FOCALIN  XR) 10 MG 24 hr capsule, Take 1 capsule (10 mg total) by mouth every morning., Disp: 30 capsule, Rfl: 0   fluticasone  (FLONASE ) 50 MCG/ACT nasal spray, SPRAY 2 SPRAYS INTO EACH NOSTRIL EVERY DAY, Disp: 48 mL, Rfl: 1   ibuprofen (ADVIL) 200 MG tablet, Take 400 mg by mouth every 6 (six) hours as needed., Disp: , Rfl:    levonorgestrel (MIRENA, 52 MG,) 20 MCG/DAY IUD, 1 each by Intrauterine route once. Inserted by GYN in 08/2019 needs to be removed in 7 yrs 08/2026, Disp: , Rfl:    loratadine (CLARITIN) 10 MG tablet, Take 10 mg by mouth daily. Alternates with Zyrtec, Disp: , Rfl:    Multiple Vitamin (MULTIVITAMIN) tablet, Take 1 tablet by mouth daily., Disp: , Rfl:    Review of Systems:   Negative unless otherwise specified per HPI.  Vitals:   Vitals:   01/06/24 1410  BP: 114/76  Pulse:  78  Temp: 97.9 F (36.6 C)  TempSrc: Temporal  SpO2: 99%  Height: 5' (1.524 m)     There is no height or weight on file to calculate BMI.  Physical Exam:   Physical Exam Vitals and nursing note reviewed.  Constitutional:      General: She is not in acute distress.    Appearance: She is well-developed. She is not ill-appearing or toxic-appearing.  Cardiovascular:     Rate and Rhythm: Normal rate and regular rhythm.     Pulses: Normal pulses.     Heart sounds: Normal heart sounds, S1 normal and S2 normal.  Pulmonary:     Effort: Pulmonary effort is normal.     Breath sounds: Normal breath sounds.  Skin:    General: Skin is warm and dry.  Neurological:     Mental Status: She is alert.     GCS: GCS eye subscore is 4. GCS verbal subscore is 5. GCS motor subscore is 6.  Psychiatric:        Speech: Speech  normal.        Behavior: Behavior normal. Behavior is cooperative.     Assessment and Plan:   Attention deficit hyperactivity disorder (ADHD), unspecified ADHD type (Primary)  Well controlled Continue focalin  10 mg XR daily Follow up in 3 month(s), sooner if concerns   Routine physical examination --> not completed today; Comprehensive Physical Exam (CPE) preventive care annual visit labs to be drawn today     Lucie Buttner, PA-C

## 2024-01-07 LAB — HEPATITIS C ANTIBODY: Hepatitis C Ab: NONREACTIVE

## 2024-01-09 ENCOUNTER — Ambulatory Visit: Payer: Self-pay | Admitting: Physician Assistant

## 2024-01-12 ENCOUNTER — Other Ambulatory Visit: Payer: Self-pay | Admitting: Physician Assistant

## 2024-01-12 MED ORDER — DEXMETHYLPHENIDATE HCL ER 10 MG PO CP24: 10.0000 mg | ORAL_CAPSULE | ORAL | 0 refills | Status: DC

## 2024-01-12 NOTE — Telephone Encounter (Unsigned)
 Copied from CRM #[id number]. Topic: Clinical - Medication Refill >> Jan 12, 2024  2:18 PM Mia F wrote: Medication: dexmethylphenidate  (FOCALIN  XR) 10 MG 24 hr capsule   Has the patient contacted their pharmacy? Yes (Agent: If no, request that the patient contact the pharmacy for the refill. If patient does not wish to contact the pharmacy document the reason why and proceed with request.) (Agent: If yes, when and what did the pharmacy advise?)  This is the patient's preferred pharmacy:  [phone] [address], [place] - [address] [place] [address] Phone: [phone] Fax: [phone]  Is this the correct pharmacy for this prescription? Yes If no, delete pharmacy and type the correct one.   Has the prescription been filled recently? Yes  Is the patient out of the medication? No  Has the patient been seen for an appointment in the last year OR does the patient have an upcoming appointment? Yes  Can we respond through MyChart? Yes  Agent: Please be advised that Rx refills may take up to 3 business days. We ask that you follow-up with your pharmacy.

## 2024-02-13 ENCOUNTER — Other Ambulatory Visit: Payer: Self-pay | Admitting: Physician Assistant

## 2024-02-13 NOTE — Telephone Encounter (Signed)
 Rx for Focalin  XR at the pharmacy.

## 2024-02-13 NOTE — Telephone Encounter (Signed)
 Copied from CRM [id number]. Topic: Clinical - Medication Refill >> Feb 13, 2024 10:21 AM Janeecia G wrote: Medication: dexmethylphenidate  10 mg  Has the patient contacted their pharmacy? No (Agent: If no, request that the patient contact the pharmacy for the refill. If patient does not wish to contact the pharmacy document the reason why and proceed with request.) (Agent: If yes, when and what did the pharmacy advise?)  This is the patient's preferred pharmacy:  [phone] [address], [place] - [address] [place] [address] Phone: [phone] Fax: [phone]  Is this the correct pharmacy for this prescription? Yes If no, delete pharmacy and type the correct one.   Has the prescription been filled recently? No  Is the patient out of the medication? No  Has the patient been seen for an appointment in the last year OR does the patient have an upcoming appointment? Yes  Can we respond through MyChart? Yes  Agent: Please be advised that Rx refills may take up to 3 business days. We ask that you follow-up with your pharmacy.

## 2024-02-16 ENCOUNTER — Telehealth: Payer: Self-pay | Admitting: *Deleted

## 2024-02-16 ENCOUNTER — Other Ambulatory Visit: Payer: Self-pay | Admitting: Physician Assistant

## 2024-02-16 MED ORDER — DEXMETHYLPHENIDATE HCL ER 10 MG PO CP24: 10.0000 mg | ORAL_CAPSULE | ORAL | 0 refills | Status: DC

## 2024-02-16 NOTE — Telephone Encounter (Signed)
 Copied from CRM [id number]. Topic: Clinical - Prescription Issue >> Feb 16, 2024  8:19 AM Deaijah H wrote: Reason for CRM: [facility] w/ [facility] called in stating there was an system error and prescription dexmethylphenidate  (FOCALIN  XR) 10 MG 24 hr capsule for September has been lost and would need it resent. Callback [phone] (ask for chase)

## 2024-02-16 NOTE — Telephone Encounter (Signed)
 Pt needs Rx for Focalin  XR 10 mg resent to pharmacy. Pharmacy said Rx was lost.

## 2024-03-22 ENCOUNTER — Other Ambulatory Visit: Payer: Self-pay | Admitting: Physician Assistant

## 2024-03-22 NOTE — Telephone Encounter (Signed)
 Copied from CRM [id number]. Topic: Clinical - Medication Refill >> Mar 22, 2024  9:02 AM Macario HERO wrote: Medication: dexmethylphenidate  (FOCALIN  XR) 10 MG 24 hr capsule [[id number]]  Has the patient contacted their pharmacy? Yes (Agent: If no, request that the patient contact the pharmacy for the refill. If patient does not wish to contact the pharmacy document the reason why and proceed with request.) (Agent: If yes, when and what did the pharmacy advise?) Only have a 30 day supply  This is the patient's preferred pharmacy:  [phone] [address], [place] - [address] [place] [address] Phone: [phone] Fax: [phone]  Is this the correct pharmacy for this prescription? Yes If no, delete pharmacy and type the correct one.   Has the prescription been filled recently? Yes  Is the patient out of the medication? No  Has the patient been seen for an appointment in the last year OR does the patient have an upcoming appointment? Yes  Can we respond through MyChart? Yes  Agent: Please be advised that Rx refills may take up to 3 business days. We ask that you follow-up with your pharmacy.

## 2024-03-22 NOTE — Telephone Encounter (Signed)
 My Chart message sent to pt, prescription should be at pharmacy.

## 2024-04-10 ENCOUNTER — Ambulatory Visit: Admitting: Physician Assistant

## 2024-04-11 ENCOUNTER — Ambulatory Visit: Admitting: Physician Assistant

## 2024-04-11 ENCOUNTER — Encounter: Payer: Self-pay | Admitting: Physician Assistant

## 2024-04-11 VITALS — BP 120/84 | HR 88 | Temp 98.0°F | Ht 60.0 in

## 2024-04-11 DIAGNOSIS — F909 Attention-deficit hyperactivity disorder, unspecified type: Secondary | ICD-10-CM

## 2024-04-11 MED ORDER — DEXMETHYLPHENIDATE HCL ER 10 MG PO CP24: 10.0000 mg | ORAL_CAPSULE | ORAL | 0 refills | Status: AC

## 2024-04-11 NOTE — Progress Notes (Signed)
 Adrienne Burch is a 38 y.o. female here for a follow up of a pre-existing problem.  History of Present Illness:   Chief Complaint  Patient presents with   ADHD    Pt here for 3 month f/u, currently taking Focalin  XR 10 mg. Tolerating medication well. Needs refills    ADHD Currently taking Focalin  XR 10 mg daily Reports there is no irritability, palpitations, anxiety, chest pain with this medication Dose is helpful, reports there is no need for increase   Past Medical History:  Diagnosis Date   ADHD (attention deficit hyperactivity disorder)    Allergy Unsure   itchy eyes, runny nose when pollen is very high. minimal reaction.   Anemia Unsure   Slightly anemic   Depression Unsure   Seasonal, doesn't happen every year.   Nevus    2 moles removed   Sleep apnea Oct 2024   minimal, treatment optional according to specialist     Social History   Tobacco Use   Smoking status: Never   Smokeless tobacco: Never  Vaping Use   Vaping status: Never Used  Substance Use Topics   Alcohol use: Yes    Comment: once per year   Drug use: Never    Past Surgical History:  Procedure Laterality Date   WISDOM TOOTH EXTRACTION     2018, 2019 half done each time    Family History  Problem Relation Age of Onset   Depression Mother    High Cholesterol Mother    Colon cancer Father 68   High blood pressure Father    Cancer Father    Alcohol abuse Sister    Crohn's disease Sister    Depression Sister    Arthritis Maternal Grandmother    High Cholesterol Maternal Grandmother    Hearing loss Maternal Grandmother    Alcohol abuse Maternal Grandfather    Heart disease Paternal Grandmother    High blood pressure Paternal Grandmother    Stroke Paternal Grandmother    Arthritis Paternal Grandmother    Lung cancer Paternal Grandfather    Colon cancer Maternal Aunt 50   Cancer Maternal Aunt    Lung cancer Maternal Uncle    Cancer Maternal Uncle     Allergies  Allergen  Reactions   Other Rash    Mango skin gets a rash, Mussels gets diarrhea   Oxycodone Nausea And Vomiting   Pertussis Vaccines Other (See Comments)    Happened as a child, listless and high fever    Current Medications:   Current Outpatient Medications:    cetirizine (ZYRTEC) 10 MG tablet, Take 10 mg by mouth daily. Alternates with Claritin, Disp: , Rfl:    dexmethylphenidate  (FOCALIN  XR) 10 MG 24 hr capsule, Take 1 capsule (10 mg total) by mouth every morning., Disp: 30 capsule, Rfl: 0   dexmethylphenidate  (FOCALIN  XR) 10 MG 24 hr capsule, Take 1 capsule (10 mg total) by mouth every morning., Disp: 30 capsule, Rfl: 0   dexmethylphenidate  (FOCALIN  XR) 10 MG 24 hr capsule, Take 1 capsule (10 mg total) by mouth every morning., Disp: 30 capsule, Rfl: 0   fluticasone  (FLONASE ) 50 MCG/ACT nasal spray, SPRAY 2 SPRAYS INTO EACH NOSTRIL EVERY DAY, Disp: 48 mL, Rfl: 1   ibuprofen (ADVIL) 200 MG tablet, Take 400 mg by mouth every 6 (six) hours as needed., Disp: , Rfl:    levonorgestrel (MIRENA, 52 MG,) 20 MCG/DAY IUD, 1 each by Intrauterine route once. Inserted by GYN in 08/2019 needs to be removed  in 7 yrs 08/2026, Disp: , Rfl:    loratadine (CLARITIN) 10 MG tablet, Take 10 mg by mouth daily. Alternates with Zyrtec, Disp: , Rfl:    Multiple Vitamin (MULTIVITAMIN) tablet, Take 1 tablet by mouth daily., Disp: , Rfl:    Review of Systems:   Negative unless otherwise specified per HPI.  Vitals:   Vitals:   04/11/24 1001  BP: 120/84  Pulse: 88  Temp: 98 F (36.7 C)  TempSrc: Temporal  SpO2: 99%  Height: 5' (1.524 m)     There is no height or weight on file to calculate BMI.  Physical Exam:   Physical Exam Vitals and nursing note reviewed.  Constitutional:      General: She is not in acute distress.    Appearance: She is well-developed. She is not ill-appearing or toxic-appearing.  Cardiovascular:     Rate and Rhythm: Normal rate and regular rhythm.     Pulses: Normal pulses.     Heart  sounds: Normal heart sounds, S1 normal and S2 normal.  Pulmonary:     Effort: Pulmonary effort is normal.     Breath sounds: Normal breath sounds.  Skin:    General: Skin is warm and dry.  Neurological:     Mental Status: She is alert.     GCS: GCS eye subscore is 4. GCS verbal subscore is 5. GCS motor subscore is 6.  Psychiatric:        Speech: Speech normal.        Behavior: Behavior normal. Behavior is cooperative.     Assessment and Plan:   Attention deficit hyperactivity disorder (ADHD), unspecified ADHD type Well controlled Continue focalin  XR 10 mg daily Follow up in 3 month(s), sooner if concerns     Lucie Buttner, PA-C

## 2024-06-02 ENCOUNTER — Other Ambulatory Visit: Payer: Self-pay | Admitting: Physician Assistant

## 2024-10-09 ENCOUNTER — Ambulatory Visit: Admitting: Physician Assistant
